# Patient Record
Sex: Male | Born: 1955
Health system: Western US, Academic
[De-identification: ages and names within clinical notes are randomized; demographics above are authoritative.]

---

## 2013-01-09 DIAGNOSIS — G4733 Obstructive sleep apnea (adult) (pediatric): Secondary | ICD-10-CM

## 2017-03-18 DIAGNOSIS — R7303 Prediabetes: Secondary | ICD-10-CM

## 2017-03-18 DIAGNOSIS — E1129 Type 2 diabetes mellitus with other diabetic kidney complication: Secondary | ICD-10-CM

## 2017-03-18 DIAGNOSIS — R809 Proteinuria, unspecified: Secondary | ICD-10-CM

## 2017-10-01 DIAGNOSIS — Z7901 Long term (current) use of anticoagulants: Secondary | ICD-10-CM

## 2018-01-20 ENCOUNTER — Ambulatory Visit: Payer: BLUE CROSS/BLUE SHIELD

## 2018-01-20 DIAGNOSIS — Z0000 Encounter for general adult medical examination without abnormal findings: Secondary | ICD-10-CM

## 2018-01-20 DIAGNOSIS — I4891 Unspecified atrial fibrillation: Secondary | ICD-10-CM

## 2018-01-20 DIAGNOSIS — I10 Essential (primary) hypertension: Secondary | ICD-10-CM

## 2018-01-20 DIAGNOSIS — E785 Hyperlipidemia, unspecified: Secondary | ICD-10-CM

## 2018-01-20 DIAGNOSIS — N50812 Left testicular pain: Secondary | ICD-10-CM

## 2018-01-20 DIAGNOSIS — Z6832 Body mass index (BMI) 32.0-32.9, adult: Secondary | ICD-10-CM

## 2018-01-20 DIAGNOSIS — E669 Obesity, unspecified: Secondary | ICD-10-CM

## 2018-01-20 DIAGNOSIS — Z23 Encounter for immunization: Secondary | ICD-10-CM

## 2018-01-20 MED ORDER — DABIGATRAN ETEXILATE MESYLATE 75 MG PO CAPS
75 mg | ORAL_CAPSULE | Freq: Two times a day (BID) | ORAL | 1 refills | Status: AC
Start: 2018-01-20 — End: 2018-01-21

## 2018-01-20 MED ORDER — LISINOPRIL 10 MG PO TABS
10 mg | ORAL_TABLET | Freq: Every day | ORAL | 1 refills | Status: AC
Start: 2018-01-20 — End: 2018-04-10

## 2018-01-20 MED ORDER — ATORVASTATIN CALCIUM 20 MG PO TABS
20 mg | ORAL_TABLET | Freq: Every day | ORAL | 1 refills | Status: AC
Start: 2018-01-20 — End: 2018-04-10

## 2018-01-20 MED ORDER — LISINOPRIL 5 MG PO TABS
5 mg | ORAL_TABLET | Freq: Every day | ORAL | 1 refills | Status: AC
Start: 2018-01-20 — End: 2018-01-21

## 2018-01-20 MED ORDER — DABIGATRAN ETEXILATE MESYLATE 150 MG PO CAPS
150 mg | ORAL_CAPSULE | Freq: Two times a day (BID) | ORAL | 0 refills | Status: SS
Start: 2018-01-20 — End: 2018-05-22

## 2018-01-20 MED ORDER — METOPROLOL TARTRATE 25 MG PO TABS
25 mg | ORAL_TABLET | Freq: Two times a day (BID) | ORAL | 1 refills | Status: AC
Start: 2018-01-20 — End: 2018-04-10

## 2018-01-20 MED ORDER — ATORVASTATIN CALCIUM 10 MG PO TABS
10 mg | ORAL_TABLET | Freq: Every day | ORAL | 1 refills | Status: AC
Start: 2018-01-20 — End: 2018-01-21

## 2018-01-20 NOTE — Assessment & Plan Note
THIS REPORT IS AUTOMATICALLY GENERATED BY THE [facility].     ADULT ASSESSMENT FEEDBACK REPORT    -------------------------    DEMOGRAPHICS    *Last 5 MRN# [id number]  *Gender: M  *Age: 62    -------------------------    Generalized Anxiety Disorder 7-item (GAD-7)  Score: GREEN (0) Individual is not currently experiencing anxiety. Highlight positive coping and encourage continued use of resiliency skills.    Patient Health Questionnaire 9-item (PHQ-9)  Score: GREEN (0) Individual does not exhibit emotional or functional problems indicative of depression. Current use of resilient behaviors should be identified and supported.    Alcohol Use Disorders Identification Test (AUDIT-C)  Score: YELLOW (4) Score of 0 on item 2 or item 3 suggests that individual is drinking within recommended limits. Individual?s alcohol intake over past few months should be reviewed to confirm accuracy.    GAD-7 Items  *only listed items scored 1 or higher (1 - Several days, 2 - More than half the days and 3 - Nearly every day)    PHQ-9 Items  *only listed items scored 1 or higher (1 - Several days, 2 - More than half the days and 3 - Nearly every day)    AUDIT-C Items  1. How often do you have a drink containing alcohol? -- 4 or more times a week (4)  2. How many standard drinks containing alcohol do you have on a typical day? -- 1 or 2 (0)  3. How often do you have six or more drinks on one occasion? -- Never (0)    Total Scores (Text Only):  GAD-7 = 0  PHQ-9 = 0  AUDIT-C = 4    --- END ---    [facility] Check-Up Report generated on 2018-01-20

## 2018-01-20 NOTE — Patient Instructions
pradaxa 150mg  twice a day  Lisinopril 10mg  once a day  Atorvastatin 20mg  once a day   Metoprolol 25mg  twice a day

## 2018-01-20 NOTE — H&P
WELLNESS VISIT        PATIENT: Calvin Chen  MRN: [id number]  DOB: [birth year]  DATE OF SERVICE: 01/20/2018    PRIMARY CARE PROVIDER: Veronda Prude, MD    Subjective:      Calvin Chen is a 62 y.o. male here for his annual wellness visit.    BP 136/69  ~ Pulse 69  ~ Temp 36.6 ???C (97.9 ???F) (Oral)  ~ Resp 18  ~ Ht 5' 7'' (1.702 m)  ~ Wt 209 lb (94.8 kg)  ~ SpO2 98%  ~ BMI 32.73 kg/m???       PHQ-9 total score:   PHQ-9 Total Score: 0 (01/20/18 1430)    GAD-7 total score:   Total score: 0 (01/20/18 1430)    DAST total score:  Total Score: 0 (01/20/18 1430)    Audit-C total score:   Total Score: 5 (01/20/18 1430)      Issues addressed today:    1. Hypertension : ran out of metoprolol so hasn't been taking it.  No chest pain or shortness of breath.      2. Hyperlipidemia : on atorvastatin 20mg  daily     3. Sleep apnea : using apap every night.    4.  Prediabetes: working on his diet and exercise.      Past Medical History:   Diagnosis Date   ??? Hyperlipidemia 2009   ??? Hypertension 2009   ??? Sleep apnea 2014    Use APAP     Past Surgical History:   Procedure Laterality Date   ??? none       Family History   Problem Relation Age of Onset   ??? Diabetes Mother    ??? Heart disease Mother    ??? Other (mitral valve disease) Brother      Social History     Social History   ??? Marital status: Married     Spouse name: N/A   ??? Number of children: N/A   ??? Years of education: N/A     Occupational History   ??? Not on file.     Social History Main Topics   ??? Smoking status: Never Smoker   ??? Smokeless tobacco: Never Used   ??? Alcohol use 2.4 oz/week     4 Glasses of Wine (5 oz) per week   ??? Drug use: No   ??? Sexual activity: Yes     Partners: Female     Other Topics Concern   ??? Do You Exercise At Least A Day, 3 Or More Days A Week? Yes   ??? Types Of Exercise? (List In Comments) Yes     Walking, cycling, gym   ??? Do You Follow A Special Diet? No   ??? Vegan? No   ??? Vegetarian? No   ??? Pescatarian? No   ??? Lactose Free? No   ??? Gluten Free? No ??? Omnivore? Yes     Social History Narrative    Diet: needs work    Exercise: has been increasing his level - walks daily; cycles 1-2 times a week, gym 1-2 times per week      Medications that the patient states to be currently taking   Medication Sig   ??? atorvastatin 20 mg tablet Take 1 tablet (20 mg total) by mouth daily.   ??? dabigatran 150 mg capsule Take 1 capsule (150 mg total) by mouth two (2) times daily.   ??? lisinopril 10 mg tablet Take 1 tablet (10 mg total) by  mouth daily.   ??? metoprolol tartrate 25 mg tablet Take 1 tablet (25 mg total) by mouth two (2) times daily.   ??? [DISCONTINUED] atorvastatin 10 mg tablet Take 10 mg by mouth daily.   ??? [DISCONTINUED] atorvastatin 10 mg tablet Take 1 tablet (10 mg total) by mouth daily.   ??? [DISCONTINUED] dabigatran 75 mg capsule Take 75 mg by mouth two (2) times daily.   ??? [DISCONTINUED] dabigatran 75 mg capsule Take 1 capsule (75 mg total) by mouth two (2) times daily.   ??? [DISCONTINUED] lisinopril 5 mg tablet Take 5 mg by mouth daily.   ??? [DISCONTINUED] lisinopril 5 mg tablet Take 1 tablet (5 mg total) by mouth daily.   ??? [DISCONTINUED] metoprolol tartrate 25 mg tablet Take 25 mg by mouth two (2) times daily.     Allergies   Allergen Reactions   ??? Sulfa Antibiotics Rash     Bactrim  Diffuse maculopapular rash, fever   ??? Naproxen Rash     Immunization History   Administered Date(s) Administered   ??? Influenza vaccine IM quadrivalent (Afluria Quad) (PF) SYR (83 years of age and older) 08/01/2017   ??? Tdap 06/23/2009   ??? influenza, unspecified formulation 06/23/2009, 10/30/2013, 07/16/2014, 09/30/2015, 08/01/2017, 09/20/2017   ??? pneumococcal polysaccharide vaccine 23-valent (Pneumovax) 03/18/2017   ??? zoster vac recomb adjuvanted (Shingrix) 01/20/2018     Health Maintenance Due   Topic Date Due   ??? HIV Screening  06/21/1976   ??? Shingles (Shingrix) Vaccine (1 of 2) 06/21/2008       Review of Systems:   A 14-system review of systems was performed and is negative except as stated in the history of present illness and patient intake form (scanned in chart).         Objective:        BP 136/69  ~ Pulse 69  ~ Temp 36.6 ???C (97.9 ???F) (Oral)  ~ Resp 18  ~ Ht 5' 7'' (1.702 m)  ~ Wt 209 lb (94.8 kg)  ~ SpO2 98%  ~ BMI 32.73 kg/m???     General Appearance:    Alert, cooperative, no distress, appears stated age     Head:    Normocephalic, without obvious abnormality, atraumatic     Eyes:    PERRL, conjunctiva/corneas clear, EOM's intact     Ears:    Normal TM's and external ear canals, both ears     Nose:   Nares normal   Throat:   Lips, mucosa, and tongue normal     Neck:   Supple, symmetrical, trachea midline, no adenopathy;        thyroid:  No enlargement/tenderness/nodules; no carotid    bruit or JVD     Back:     Symmetric, no curvature     Lungs:     Clear to auscultation bilaterally, respirations unlabored     Chest wall:    No tenderness or deformity     Heart:    Regular rate and rhythm, S1 and S2 normal, no murmur, rub    or gallop     Abdomen:     Soft, non-tender, bowel sounds active,     no masses, no organomegaly, obese      Genitalia:    Normal male without lesion, testis nl, no hernias  Left testicle is tender, small nodule on skin adjacent to scrotum      Rectal:    def       Extremities:  Extremities normal, atraumatic, no cyanosis or edema     Pulses:   2+ and symmetric      Skin:   Skin color, texture, turgor normal, no rashes or lesions         Neurologic:   nonfocal             Lab Review:   No results found for: CHOL, CHOLHDL, CHOLDLCAL, CHOLDLQ, [facility] Visit on 04/01/2008   Component Date Value Ref Range Status   ??? PSA,Total 04/01/2008 0.97  ng/mL Final    Comment: Total PSA Reference Range:                           58 - 76 year old:  0.0-2.5 ng/mL                           31 - 48 year old:  0.0-3.5 ng/mL                           68 - 38 year old:  0.0-4.5 ng/mL                           25 - 34 year old:  0.0-6.5 ng/mL Method: Roche-Electrochemiluminescence                           WARNING: The concentration of PSA in a given specimen                           determined with assays from different manufacturers can                           vary due to differences in assay methods and reagent                           specificity. Values obtained with different assay methods                           cannot be used interchangeably.                 Assessment & Plan:        Tanuj Mullens is a 62 y.o. male here for his yearly physical exam.  Diet, sleep and exercise were discussed.    Diagnoses and all orders for this visit:    Wellness examination  -     Referral to Clinical Nutrition  -     CBC & Auto Differential; Future  -     Comprehensive Metabolic Panel; Future  -     TSH with reflex FT4, FT3; Future  -     Lipid Panel; Future  -     Hgb A1c; Future    Pain in left testicle  -     Referral to Urology, Adult Urology  -     US scrotum and testicles wo+w duplex; Future    Class 1 obesity with serious comorbidity and body mass index (BMI) of 32.0 to 32.9 in adult, unspecified obesity type    Prediabetes  -     Hgb A1c; Future    Hyperlipidemia,  unspecified hyperlipidemia type  -     atorvastatin 20 mg tablet; Take 1 tablet (20 mg total) by mouth daily.    Long term current use of anticoagulant    Atrial fibrillation, unspecified type (HCC/RAF)  -     dabigatran 150 mg capsule; Take 1 capsule (150 mg total) by mouth two (2) times daily.    Essential hypertension  -     metoprolol tartrate 25 mg tablet; Take 1 tablet (25 mg total) by mouth two (2) times daily.  -     lisinopril 10 mg tablet; Take 1 tablet (10 mg total) by mouth daily.  -     CBC & Auto Differential; Future  -     Comprehensive Metabolic Panel; Future  -     TSH with reflex FT4, FT3; Future  -     Lipid Panel; Future  -     Hgb A1c; Future    Need for zoster vaccination  -     Zoster Vaccine (Shingrix) (order to administer in-clinic) [SINGLE ORDER, FOR ADMIN TODAY IN-OFFICE]    rec weight loss     Return in about 3 months (around 04/22/2018).    Veronda Prude, MD  Family Medicine  CPN [facility]

## 2018-01-21 LAB — TSH with reflex FT4, FT3: TSH: 0.99 u[IU]/mL (ref 0.3–4.7)

## 2018-01-21 LAB — Lipid Panel: NON-HDL,CHOLESTEROL,CALC: 150 mg/dL — ABNORMAL HIGH (ref <130)

## 2018-01-21 LAB — Comprehensive Metabolic Panel: POTASSIUM: 4.4 mmol/L (ref 3.6–5.3)

## 2018-01-21 LAB — Differential Automated: ABSOLUTE LYMPHOCYTE COUNT: 1.72 10*3/uL (ref 1.30–3.40)

## 2018-01-21 LAB — Hgb A1c: HGB A1C - HPLC: 6.7 — ABNORMAL HIGH (ref <5.7)

## 2018-01-21 LAB — CBC: MEAN CORPUSCULAR VOLUME: 85.1 fL (ref 79.3–98.6)

## 2018-02-19 ENCOUNTER — Ambulatory Visit

## 2018-03-16 ENCOUNTER — Ambulatory Visit

## 2018-04-01 ENCOUNTER — Ambulatory Visit

## 2018-04-08 ENCOUNTER — Ambulatory Visit

## 2018-04-08 DIAGNOSIS — E785 Hyperlipidemia, unspecified: Secondary | ICD-10-CM

## 2018-04-08 DIAGNOSIS — I480 Paroxysmal atrial fibrillation: Secondary | ICD-10-CM

## 2018-04-08 DIAGNOSIS — I10 Essential (primary) hypertension: Secondary | ICD-10-CM

## 2018-04-08 MED ORDER — RIVAROXABAN 20 MG PO TABS
20 mg | ORAL_TABLET | Freq: Every day | ORAL | 3 refills | Status: SS
Start: 2018-04-08 — End: 2018-05-20

## 2018-04-08 NOTE — Progress Notes
Outpatient Cardiology Consultation    PATIENT: Calvin Chen  MRN: [id number]  DOB: Jan 26, 1956  DATE OF SERVICE: 04/08/2018      PRIMARY CARE PROVIDER: Veronda Prude, MD    REFERRING PHYSICIAN:   Veronda Prude, MD  [address]  [id number]  [place], [place] [address]    DATE OF SERVICE: 04/08/2018    REASON FOR CONSULTATION: Paroxysmal atrial fibrillation    PROBLEM LIST:    Calvin Chen is a 61 y.o. male with a history of:    Paroxysmal Atrial Fibrillation:   Previously followed at [facility] and per patient on pradaxa, however not covered by insurance but xarelto is.  On metoprolol.  Hx of Afib with RVR 07/2017 Calvin Chen)    HTN:  On metoprolol 25 po bid and lisinopril 10.      Dyslipidemia:  On atorvastatin 20 mg    DM:  A1c 6.7 on June 2019.  Being followed by PCP.  Not on any DM meds.    HISTORY OF PRESENT ILLNESS/INTEVAL EVENTS:  04/08/18:   PATIENT PRESENTS TODAY TO ESTABLISH CARE IN CARDIOLOGY CLINIC FOR THE ABOVE mentioned problems.  Patient presents today to establish care in Cardiology Clinic for the above-mentioned problems he denies any symptoms.  Denies any chest pain, shortness of breath, dyspnea on exertion, palpitations, presyncope, or syncope.  He states that he was previously on Pradaxa however he was told that that is not covered by insurance any further and but the Xarelto is being covered.  Patient takes lisinopril and metoprolol as described above for blood pressure however he does not check blood pressure at home and is not sure what his baseline blood pressure.  Not taking any diabetes medications.      PAST MEDICAL HISTORY:  Past Medical History:   Diagnosis Date   ??? Hyperlipidemia 2009   ??? Hypertension 2009   ??? Sleep apnea 2014    Use APAP       PAST SURGICAL HISTORY:  Past Surgical History:   Procedure Laterality Date   ??? none         OUTPATIENT MEDICATIONS:  Medications that the patient states to be currently taking   Medication Sig   ??? atorvastatin 20 mg tablet Take 1 tablet (20 mg total) by mouth daily. ??? dabigatran 150 mg capsule Take 1 capsule (150 mg total) by mouth two (2) times daily.   ??? lisinopril 10 mg tablet Take 1 tablet (10 mg total) by mouth daily.   ??? metoprolol tartrate 25 mg tablet Take 1 tablet (25 mg total) by mouth two (2) times daily.       ALLERGIES:  Allergies   Allergen Reactions   ??? Sulfa Antibiotics Rash     Bactrim  Diffuse maculopapular rash, fever   ??? Naproxen Rash       SOCIAL HISTORY:  Social History     Social History   ??? Marital status: Married     Spouse name: N/A   ??? Number of children: N/A   ??? Years of education: N/A     Social History Main Topics   ??? Smoking status: Never Smoker   ??? Smokeless tobacco: Never Used   ??? Alcohol use 2.4 oz/week     4 Glasses of Wine (5 oz) per week   ??? Drug use: No   ??? Sexual activity: Yes     Partners: Female     Other Topics Concern   ??? Do You Exercise At Least A Day, 3  Or More Days A Week? Yes   ??? Types Of Exercise? (List In Comments) Yes     Walking, cycling, gym   ??? Do You Follow A Special Diet? No   ??? Vegan? No   ??? Vegetarian? No   ??? Pescatarian? No   ??? Lactose Free? No   ??? Gluten Free? No   ??? Omnivore? Yes     Social History Narrative    Diet: needs work    Exercise: has been increasing his level - walks daily; cycles 1-2 times a week, gym 1-2 times per week        FAMILY HISTORY:  Family History   Problem Relation Age of Onset   ??? Diabetes Mother    ??? Heart disease Mother    ??? Other (mitral valve disease) Brother        REVIEW OF SYSTEMS:  A 14 point review of systems was conducted and as per HPI and Interval Events otherwise negative.     PHYSICAL EXAMINATION:  VITALS: BP 155/95  ~ Pulse 71  ~ Temp 36.8 ???C (98.3 ???F) (Oral)  ~ Resp 18  ~ Ht 5' 7'' (1.702 m)  ~ Wt 216 lb (98 kg)  ~ SpO2 96%  ~ BMI 33.83 kg/m???    General: well developed well nourished male in no acute distress  HEENT: extraocular movements intact, anicteric, oropharynx clear, moist mucous membranes  Neck: no jugular venous distention. No lymphadenopathy CV: regular rate and rhythm, normal S1, S2. no rubs, murmurs, or gallops  Pulm: clear to auscultation bilaterally, no retractions  GI: soft, non tender, non distended, normoactive bowel sounds, no hepatosplenomegaly  Ext: no clubbing, cyanosis, or edema  Vascular: 2+ pulses throughout  Skin: warm, dry , and well perfused  Neuro: grossly non focal  Psych: mood and affect appropriate  MS: strength intact        LABORATORY:  Lab Results   Component Value Date    HGBA1C 6.7 (H) 01/20/2018     Last 3 Lipids (Up to last 3 results from past 62952 hours)      06/03 1556    Cholesterol       198  Comment:  The significance of total cholesterol depends on the values of LDL, HDL, triglycerides and the clinical context. A patient-provider discussion may be considered.       Cholesterol, HDL       48  Comment:  If HDL cholesterol level falls outside of the designatedrange, a patient-provider discussion is recommended       Triglycerides       178  Comment:  If Triglyceride level falls outside of the designated range,a patient-provider discussion is recommended.(!)       Non-HDL,Chol,Calc       150  Comment:  If Non-HDL cholesterol level falls outside of the designatedrange, a patient-provider discussion is recommended.(!)           2018 ACC/AHA guidelines recommends moderate or high-intensity statin because 10--year ASCVD risk>=7.5%. Patient is receiving guideline-directed statin therapy; monitor adherence.  10-year ASCVD risk  is 13.0%. Continue moderate or high-intensity statin therapy. Monitor adherence. as of 9:34 PM on 04/08/2018  10-year ASCVD risk with optimal risk factors is 4.6%.  Values used to calculate ASCVD score:  Age: 62 y.o.   Gender: Male Race: Not African American.  HDL cholesterol: 48 mg/dL. HDL cholesterol measured 78 days ago.  Total cholesterol: 198 mg/dL. Total cholesterol measured 78 days ago.  LDL cholesterol: 114 mg/dL.  LDL cholesterol measured 78 days ago. Systolic BP: 155 mm Hg. BP was measured today.  The patient is being treated with a medication that influences SBP.  The patient is currently not a smoker.  The patient does not have a diagnosis of diabetes.    Click here for the [facility] ASCVD Cardiovascular Risk Estimator Plus tool Office manager).        DIAGNOSTIC STUDIES:  ECG performed on 04/08/18 reviewed by me shows NSR 71 bpm, PR 138 msec, QRS 98 msec, QTc 434 msec.  TWI lateral leads.     TTE [facility] 07/2017  1. Technically difficult study.  2. Normal LV size, mild LVH, normal LVEF 55-60%.  3. Mild left atrial enlargement.  4. No significant valvular abnormalities.  5. IVC diameter is 2.2 cm with a < 50% inspiratory collapse, suggestive of a  right atrial pressure of 10-20 mmHg.    [facility] 07/2017  Cath Findings:   Coronary angiography   Left main: Minimal luminal irregularity   LAD : Minimal luminal irregularity   Diagonal : Minimal luminal irregularity   Circumflex : Minimal luminal irregularity   Obtuse marginal : Minimal luminal irregularity   RCA : Dominant vessel. Minimal luminal irregularity   PDA : Minimal luminal irregularity   PLV : Minimal luminal irregularity     Hemodynamics:   LVEDP 17 mmHg  LV 171/0 mmHg  AO 185/93/132 mmHg  ???  Left Ventriculogram??????  LV EF 60-65%  LV regional Motion abnormality: None  Mitral regurgitation: None     ASSESSMENT:  Calvin Chen is a 62 y.o. male with:    Paroxysmal Atrial Fibrillation:   Previously followed at [facility] and per patient on pradaxa, however not covered by insurance but xarelto is.  On metoprolol.  Hx of Afib with RVR 07/2017 Calvin Chen)  -Continue metoprolol 25 po bid  -Rivaroxaban 20 mg daily prescribed (CHA2DS2-Vasc score of 2 HTN and DM)    HTN:  On metoprolol 25 po bid and lisinopril 10.    - Above goal.  Advised daily BP logs at home in AM    Dyslipidemia:  On atorvastatin 20 mg  -Continue moderate dose statin.      Prediabetes/DM:  A1c 6.7 on June 2019.  Being followed by PCP.  Not on any DM meds. -As per PCP    In the absence of any new symptoms patient will return to clinic in 12 months.  Return precautions discussed and contact information provided.    Thank you Dr. Lucienne Minks for allowing me to participate in the care of your patient. Please do not hesitate to contact me if any questions or concerns arise in the meantime.     Lennox Solders MD PhD  [facility]  [address] [place], [address] [place]  Telephone: [phone]  Fax: [phone]  Email: [email or web address]  ???      Author: Lennox Solders, MD 04/08/2018 4:19 PM      paf  htn  [facility]  Had cath [facility] 07/2017

## 2018-04-09 DIAGNOSIS — E785 Hyperlipidemia, unspecified: Secondary | ICD-10-CM

## 2018-04-09 DIAGNOSIS — I10 Essential (primary) hypertension: Secondary | ICD-10-CM

## 2018-04-10 MED ORDER — LISINOPRIL 10 MG PO TABS
10 mg | ORAL_TABLET | Freq: Every day | ORAL | 1 refills | Status: SS
Start: 2018-04-10 — End: 2018-05-23

## 2018-04-10 MED ORDER — ATORVASTATIN CALCIUM 20 MG PO TABS
20 mg | ORAL_TABLET | Freq: Every day | ORAL | 1 refills | Status: AC
Start: 2018-04-10 — End: 2019-02-03

## 2018-04-10 MED ORDER — METOPROLOL TARTRATE 25 MG PO TABS
25 mg | ORAL_TABLET | Freq: Two times a day (BID) | ORAL | 1 refills | Status: AC
Start: 2018-04-10 — End: 2018-12-05

## 2018-05-19 ENCOUNTER — Inpatient Hospital Stay
Admit: 2018-05-19 | Discharge: 2018-05-22 | Disposition: A | Discharge status: Home / Self Care | Source: Home / Self Care

## 2018-05-19 ENCOUNTER — Ambulatory Visit

## 2018-05-19 DIAGNOSIS — I160 Hypertensive urgency: Secondary | ICD-10-CM

## 2018-05-19 DIAGNOSIS — N10 Acute tubulo-interstitial nephritis: Secondary | ICD-10-CM

## 2018-05-19 DIAGNOSIS — Q631 Lobulated, fused and horseshoe kidney: Secondary | ICD-10-CM

## 2018-05-19 DIAGNOSIS — N200 Calculus of kidney: Secondary | ICD-10-CM

## 2018-05-19 DIAGNOSIS — I10 Essential (primary) hypertension: Secondary | ICD-10-CM

## 2018-05-19 LAB — Sepsis Lactate Protocol: BLOOD LACTATE: 12 mg/dL (ref 5–25)

## 2018-05-19 LAB — Magnesium: MAGNESIUM: 2.1 meq/L — ABNORMAL HIGH (ref 1.4–1.9)

## 2018-05-19 LAB — Blood Culture Detection
BLOOD CULTURE FINAL STATUS: NEGATIVE
BLOOD CULTURE FINAL STATUS: NEGATIVE
BLOOD CULTURE FINAL STATUS: NEGATIVE
BLOOD CULTURE FINAL STATUS: NEGATIVE

## 2018-05-19 LAB — CBC
HEMATOCRIT: 48.2 (ref 38.5–52.0)
MEAN PLATELET VOLUME: 9.6 fL (ref 9.3–13.0)

## 2018-05-19 LAB — Prothrombin Time Panel: INR: 1.1 s (ref 11.5–14.4)

## 2018-05-19 LAB — Troponin I: TROPONIN I: <0.04 ng/mL (ref <0.1)

## 2018-05-19 LAB — Phosphorus: PHOSPHORUS: 3.9 mg/dL (ref 2.3–4.4)

## 2018-05-19 LAB — Hepatic Funct Panel: BILIRUBIN,TOTAL: 0.8 mg/dL (ref 0.1–1.2)

## 2018-05-19 LAB — Differential Automated: MONOCYTE PERCENT, AUTO: 6.6 (ref 0.00–0.10)

## 2018-05-19 LAB — Basic Metabolic Panel
CREATININE: 1.38 mg/dL — ABNORMAL HIGH (ref 0.60–1.30)
GFR ESTIMATE FOR NON-AFRICAN AMERICAN: 64 mL/min/{1.73_m2} (ref 96–106)
GLUCOSE: 167 mg/dL — ABNORMAL HIGH (ref 65–99)
POTASSIUM: 4.2 mmol/L (ref 3.6–5.3)

## 2018-05-19 LAB — UA,Dipstick: BILIRUBIN: NEGATIVE (ref 1.005–1.030)

## 2018-05-19 LAB — B-Type Natriuretic Peptide: BNP: 145 pg/mL — ABNORMAL HIGH (ref <100)

## 2018-05-19 LAB — UA,Microscopic: WBCS: 8 {cells}/uL (ref 0–22)

## 2018-05-19 LAB — Lipase: LIPASE: 10 U/L (ref 9–63)

## 2018-05-19 LAB — Amylase: AMYLASE: 69 U/L (ref 31–124)

## 2018-05-19 MED ADMIN — LISINOPRIL 10 MG PO TABS: 10 mg | ORAL | @ 17:00:00 | Stop: 2018-05-21

## 2018-05-19 MED ADMIN — LABETALOL HCL 5 MG/ML IV SOLN: 20 mg | INTRAVENOUS | @ 12:00:00 | Stop: 2018-05-21 | NDC 00143962201

## 2018-05-19 MED ADMIN — FENTANYL CITRATE (PF) 100 MCG/2ML IJ SOLN: 50 ug | INTRAVENOUS | @ 12:00:00 | Stop: 2018-05-19 | NDC 00641602725

## 2018-05-19 MED ADMIN — MORPHINE SULFATE (PF) 4 MG/ML IV SOLN: 4 mg | INTRAVENOUS | @ 10:00:00 | Stop: 2018-05-19 | NDC 00641612525

## 2018-05-19 MED ADMIN — LISINOPRIL 10 MG PO TABS: 10 mg | ORAL | @ 08:00:00 | Stop: 2018-05-19 | NDC 60687032501

## 2018-05-19 MED ADMIN — METOPROLOL TARTRATE 5 MG/5ML IV SOLN: 5 mg | INTRAVENOUS | @ 08:00:00 | Stop: 2018-05-19 | NDC 00409177805

## 2018-05-19 MED ADMIN — MORPHINE SULFATE (PF) 4 MG/ML IV SOLN: 4 mg | INTRAVENOUS | @ 07:00:00 | Stop: 2018-05-19 | NDC 00641612525

## 2018-05-19 MED ADMIN — ATORVASTATIN CALCIUM 20 MG PO TABS: 20 mg | ORAL | @ 17:00:00 | Stop: 2018-05-23 | NDC 00904629161

## 2018-05-19 MED ADMIN — METOPROLOL TARTRATE 25 MG PO TABS: 25 mg | ORAL | @ 17:00:00 | Stop: 2018-05-23 | NDC 51079025520

## 2018-05-19 MED ADMIN — LACTATED RINGERS IV SOLN: 75 mL/h | INTRAVENOUS | @ 12:00:00 | Stop: 2018-05-21 | NDC 00338011704

## 2018-05-19 MED ADMIN — IOHEXOL 350 MG/ML IV SOLN: 125 mL | INTRAVENOUS | @ 09:00:00 | Stop: 2018-05-19 | NDC 00407141476

## 2018-05-19 MED ADMIN — DOCUSATE SODIUM 100 MG PO CAPS: 100 mg | ORAL | @ 19:00:00 | Stop: 2018-05-19 | NDC 00904645561

## 2018-05-19 MED ADMIN — LISINOPRIL 10 MG PO TABS: 10 mg | ORAL | @ 17:00:00 | Stop: 2018-05-21 | NDC 60687032501

## 2018-05-19 MED ADMIN — CEFTRIAXONE 1GM IVPB (MINIBAG): 1 g | INTRAVENOUS | @ 09:00:00 | Stop: 2018-05-19 | NDC 00409733201

## 2018-05-19 MED ADMIN — SODIUM CHLORIDE 0.9 % IV BOLUS: 1000 mL | INTRAVENOUS | @ 07:00:00 | Stop: 2018-05-19 | NDC 00338004904

## 2018-05-19 MED ADMIN — OXYCODONE HCL 5 MG PO TABS: 5 mg | ORAL | @ 17:00:00 | Stop: 2018-05-22 | NDC 00406055262

## 2018-05-19 MED ADMIN — CLONIDINE HCL 0.1 MG PO TABS: .1 mg | ORAL | @ 09:00:00 | Stop: 2018-05-19 | NDC 00904565661

## 2018-05-19 MED ADMIN — HYDRALAZINE HCL 20 MG/ML IJ SOLN: 10 mg | INTRAVENOUS | @ 11:00:00 | Stop: 2018-05-19 | NDC 63323061401

## 2018-05-19 MED ADMIN — ONDANSETRON HCL 4 MG/2ML IJ SOLN: 4 mg | INTRAVENOUS | @ 20:00:00 | Stop: 2018-05-23 | NDC 00641607825

## 2018-05-19 MED ADMIN — METOPROLOL TARTRATE 25 MG PO TABS: 25 mg | ORAL | @ 17:00:00 | Stop: 2018-05-23

## 2018-05-19 MED ADMIN — MORPHINE SULFATE (PF) 4 MG/ML IV SOLN: 4 mg | INTRAVENOUS | @ 09:00:00 | Stop: 2018-05-19 | NDC 00641612525

## 2018-05-19 MED ADMIN — NICARDIPINE 50 MG/250 ML DRIP (PERIPHERAL): 5 mg/h | INTRAVENOUS | @ 15:00:00 | Stop: 2018-05-19

## 2018-05-19 MED ADMIN — ONDANSETRON HCL 4 MG/2ML IJ SOLN: 4 mg | INTRAVENOUS | @ 07:00:00 | Stop: 2018-05-19 | NDC 00641607825

## 2018-05-19 MED ADMIN — HYDRALAZINE HCL 20 MG/ML IJ SOLN: 20 mg | INTRAVENOUS | @ 13:00:00 | Stop: 2018-05-19 | NDC 63323061401

## 2018-05-19 MED ADMIN — OXYCODONE HCL 5 MG PO TABS: 5 mg | ORAL | @ 12:00:00 | Stop: 2018-05-22 | NDC 00406055262

## 2018-05-19 MED ADMIN — ATORVASTATIN CALCIUM 20 MG PO TABS: 20 mg | ORAL | @ 17:00:00 | Stop: 2018-05-23

## 2018-05-19 NOTE — H&P
PULMONARY & CRITICAL CARE PROGRESS NOTE      PRIMARY PHYSICIAN: Dr. Veronda Prude    LOS: 0  Admitted on 05/19/2018  Admitted to ICU on 05/19/2018  Reason for transfer: Uncontrolled Hypertension    HPI: Calvin Chen is a 62 y.o. male with PMH of HTN, HLD, and paroxysmal Afib admitted to the ICU with nephrolithiasis and uncontrolled hypertension. Pt presented to the ED with severe lower abdominal pain radiating to the back for the past 1 day. Pt was initially waxing and waning and sharp in nature, but has since become constant. Pain associated with nausea and vomiting earlier in the day, but that has since resolved. Pt denies any hematuria but does endorse dysuria yesterday which has since resolved.  Denies any associated CP or SOB. ED work up revealed leukocytosis, hematuria (on UA), and CT ab/pel showing horseshoe kidney with 3mm ureteral stone. Pt was incidentally noted to be hypertensive with persistently elevated SBP >200 while in the ED despite IV and PO medications and was admitted to the ICU for further care. Of noted, pt given ABx and urology consulted.     HOSPITAL EVENTS:    Past Medical History:   Diagnosis Date   ??? Hyperlipidemia 2009   ??? Hypertension 2009   ??? Sleep apnea 2014    Use APAP     Past Surgical History:   Procedure Laterality Date   ??? none       Allergies   Allergen Reactions   ??? Sulfa Antibiotics Rash     Bactrim  Diffuse maculopapular rash, fever   ??? Naproxen Rash     Social History   Substance Use Topics   ??? Smoking status: Never Smoker   ??? Smokeless tobacco: Never Used   ??? Alcohol use 2.4 oz/week     4 Glasses of Wine (5 oz) per week     Family History   Problem Relation Age of Onset   ??? Diabetes Mother    ??? Heart disease Mother    ??? Other (mitral valve disease) Brother        ROS: +severe lower abdominal pain, +N/V, now resolved, otherwise negative in all pertinent systems  HOME MEDICATIONS:  Prior to Admission medications    Medication Sig Start Date End Date Taking? Authorizing Provider atorvastatin 20 mg tablet Take 1 tablet (20 mg total) by mouth daily. 04/09/18   Veronda Prude, MD   dabigatran 150 mg capsule Take 1 capsule (150 mg total) by mouth two (2) times daily. 01/20/18   Veronda Prude, MD   lisinopril 10 mg tablet Take 1 tablet (10 mg total) by mouth daily. 04/09/18   Veronda Prude, MD   metoprolol tartrate 25 mg tablet Take 1 tablet (25 mg total) by mouth two (2) times daily. 04/09/18   Veronda Prude, MD   rivaroxaban (XARELTO) 20 mg tablet Take 1 tablet (20 mg total) by mouth daily with dinner. 04/08/18   Lennox Solders, MD       SCHEDULED MEDS:  ??? atorvastatin  20 mg Oral Daily   ??? cloNIDine  0.1 mg Oral BID   ??? lisinopril  10 mg Oral Daily   ??? lisinopril  10 mg Oral Daily   ??? metoprolol tartrate  25 mg Oral BID     CONTINUOUS INFUSIONS:  ??? niCARdipine 50 mg/250 ml (0.2 mg/ml) drip (Peripheral)       PRN MEDS:    HEMODYNAMIC PARAMETERS:     No intake/output data recorded.   No  intake or output data in the 24 hours ending 05/19/18 0403    VENT SETTINGS:        EKG/TELEMETRY: NSR with lateral TWI unchanged from prior    PHYSICAL EXAMINATION:  Temp:  [36.9 ???C (98.5 ???F)] 36.9 ???C (98.5 ???F)  Heart Rate:  [56-74] 63  Resp:  [14-18] 14  BP: (206-232)/(95-162) 218/114  SpO2:  [93 %-98 %] 93 %       INVASIVE DEVICES:    Gen: Uncomfortable, diaphoretic, but non-toxic appearing male, in pain  HEENT: NCAT, EOMI, MMM  CV: RRR, normal S1/S2, no MRG  Resp: CTABL  GI: Soft, +TTP in lower abdomen, no rebound/guarding/rigidity, no upper abdominal TTP  Neuro: AAOx3, moving all ext, following commands  Peripheral Pulse: normal  Skin color: pink  Cap refill: less than 2 seconds        LABORATORY STUDIES:  Recent Labs      05/19/18   0012   WBC  15.77*   HGB  16.5   HCT  48.2   PLT  305     Recent Labs      05/19/18   0012   NA  138   K  3.9   CL  98   CO2  28   BUN  18   CREAT  1.23   GFRESTNOAA  64   GFRESTAA  74   GLUCOSE  167*   CALCIUM  9.5      Recent Labs      05/19/18   0227   LACTATE  12     Recent Labs 05/19/18   0012   INR  1.1   PT  13.4     Recent Labs      05/19/18   0012   TOTPRO  8.3*   ALBUMIN  4.6   BILITOT  0.8   BILICON  0.2   ALT  19   AST  18   ALKPHOS  99   AMYLASE  69   LIPASE  10         MICROBIOLOGY:  - BCX #1 09/30: Pending  - BCX #2 09/30: Pending  - UCX 09/30: Pending    IMAGING:  - CT Ab/Pel 09/30:   1. ???Horseshoe kidney with 3 mm stone in the distal right ureter with associated mild hydronephrosis and heterogeneous parenchymal enhancement, suspicious for pyelonephritis.  2. ???Fluid-filled transverse and ascending colon with multiple foci of luminal narrowing, most compatible with bowel peristalsis. There is also equalization of the distal small bowel and the left abdomen, nonspecific but possibly representing delay in   bowel transit time.  3. ???Multiple lesions throughout the liver with suggestion of discontinuous peripheral enhancement, compatible with benign hemangiomas.    ASSESSMENT:  Calvin Chen is a 62 y.o. male with PMH of HTH, HLD, and Afib presenting with 1 day of severe abdominal pain, found to have rt ureteral stone and SBP >200, admitted to the ICU with the following active issues:  - Uncontrolled Hypertension  - Nephrolithiasis with solitary kidney  - ?Pyelonephritis  - Severe abdominal pain  - Afib  - HLD     PLAN/RECOMMENDATIONS:  - Uncontrolled Hypertension: Likely driven by pain, no evidence of any end organ damage as Cr at baseline and trop negative, continue to treat pain as below, will continue home meds (metop and lisinopril), continue PRN hydralazine/labetalol, will consider gtt if continues to remain uncontrolled  - Nephrolithiasis in solitary kidney: Urology consulted given hydro/stranding as seen on  CT, will continue IVF and monitoring for passing of stone, f/u Uro recs  - ?Pyelonephritis: UA findings could all be explained by stone, however given CT findings, hx of dysuria, and solitary kidney, will continue Ceftriaxone, and f/u Cx - Severe abdominal pain: Likely secondary to kidney stone, no aortic dissection or AAA seen on CT, continue PRN tylenol, oxycodone, and fentanyl pain, will uptitrate as needed  - Afib: Hold anticoagulation in case of possible procedures, however can likely resume tomorrow if no planned procedure  - HLD: Continue statin      SEDATION: None  ANALGESIA: tylenol, oxy, and fentanyl for breakthrough  NUTRITION: NPO for now  PPX: SCDs  CODE STATUS: Full  SURROGATE CONTACT: Wife    Critical care was necessary to treat or prevent imminent or life-threatening deterioration of the respiratory status and hemodynamics.  Critical Care Time excluding procedures:  45 minutes on 05/19/2018.       [place], MD  05/19/2018  4:03 AM

## 2018-05-19 NOTE — ED Provider Notes
[facility]  Emergency Department Service Report    Calvin Chen 62 y.o. male , presents with Abdominal Pain      Triage   Arrived on 05/18/2018 at 11:23 PM   Arrived by Walk-in [14]    ED Triage Vitals [05/18/18 2337]   Temp Temp Source BP Heart Rate Resp SpO2 O2 Device Pain Score Weight   36.9 ???C (98.5 ???F) Oral (!) 209/95 74 18 98 % None (Room air) Five 93.9 kg (207 lb)       Pre hospital care:       Allergies   Allergen Reactions   ??? Sulfa Antibiotics Rash     Bactrim  Diffuse maculopapular rash, fever   ??? Naproxen Rash       History   Patient is a 62 y.o. male with hx of HTN, HLD, pre-diabetes, previously on pradaxa and currently on metoprolol for A-fib, who presents to the ED with complaint of bilateral lower abdominal pain starting yesterday. Pain began gradually, was intermittent progressed to constant, aching; it is non-radiating, and without alleviating factors. Associated symptoms include back pain, constipation, nausea, nbnb vomiting x 4-5 times. Endorses mild dysuria starting yesterday however had spontaneously resolved today. No fevers. No surgical abdomen. Patient states he has not passed gas.      The history is provided by the patient. No language interpreter was used.   Abdominal Pain   The primary symptoms of the illness include abdominal pain, nausea, vomiting and dysuria. The primary symptoms of the illness do not include diarrhea. The onset of the illness was gradual. The problem has not changed since onset.  The pain came on gradually. The abdominal pain is located in the RLQ and LLQ. The abdominal pain does not radiate.   Additional symptoms associated with the illness include constipation and back pain.            Past Medical History:   Diagnosis Date   ??? Hyperlipidemia 2009   ??? Hypertension 2009   ??? Sleep apnea 2014    Use APAP        Past Surgical History:   Procedure Laterality Date   ??? none          Past Family History family history includes Diabetes in his mother; Heart disease in his mother; mitral valve disease in his brother.     Past Social History   he reports that he has never smoked. He has never used smokeless tobacco. He reports that he drinks about 2.4 oz of alcohol per week . He reports that he currently engages in sexual activity and has had male partners. He reports that he does not use drugs.     Review of Systems   Gastrointestinal: Positive for abdominal pain, constipation, nausea and vomiting. Negative for diarrhea.   Genitourinary: Positive for dysuria.   Musculoskeletal: Positive for back pain.   All other systems reviewed and are negative.      Physical Exam   Physical Exam   Constitutional: He appears well-developed and well-nourished. No distress.   HENT:   Head: Normocephalic and atraumatic.   Eyes: Conjunctivae and EOM are normal.   Neck: Normal range of motion. Neck supple.   Cardiovascular: Normal rate and regular rhythm.    Pulmonary/Chest: Effort normal and breath sounds normal. No respiratory distress.   Abdominal: Soft. There is tenderness (bilateral lower quadrants). There is no rebound and no guarding.   Decreased bowel sounds but still present  Musculoskeletal: Normal range of motion.   Lymphadenopathy:     He has no cervical adenopathy.   Neurological: He is alert. No sensory deficit.   Moving all four extremities.   Skin: Skin is warm and dry.   Psychiatric: His behavior is normal.   Nursing note and vitals reviewed.      ED Course          Laboratory Results     Labs Reviewed   BASIC METABOLIC PANEL - Abnormal; Notable for the following:        Result Value    Glucose 167 (*)     All other components within normal limits   HEPATIC FUNCT PANEL - Abnormal; Notable for the following:     Total Protein 8.3 (*)     All other components within normal limits   UA,DIPSTICK - Abnormal; Notable for the following:     Blood 2+ (*)     Glucose 1+ (*)     Protein 2+ (*) All other components within normal limits   UA,MICROSCOPIC - Abnormal; Notable for the following:     RBC per uL 331 (*)     RBC per HPF 70 (*)     All other components within normal limits   CBC (PERFORMABLE) - Abnormal; Notable for the following:     White Blood Cell Count 15.77 (*)     All other components within normal limits   DIFFERENTIAL, AUTOMATED (PERFORMABLE) - Abnormal; Notable for the following:     Absolute Neut Count 13.33 (*)     Absolute Lymphocyte Count 1.23 (*)     Absolute Mono Count 1.04 (*)     Absolute Immature Gran Count 0.08 (*)     All other components within normal limits   PROTHROMBIN TIME PANEL - Normal   LIPASE - Normal   AMYLASE - Normal   BACTERIAL CULTURE BLOOD    Narrative:     The following orders were created for panel order Blood culture #1.  Procedure                               Abnormality         Status                     ---------                               -----------         ------                     Bacterial Culture [id number]]                          In process                   Please view results for these tests on the individual orders.   BACTERIAL CULTURE BLOOD    Narrative:     The following orders were created for panel order Blood culture #2.  Procedure                               Abnormality         Status                     ---------                               -----------         ------  Bacterial Culture [id number]]                          In process                   Please view results for these tests on the individual orders.   BACTERIAL CULTURE URINE   BACTERIAL CULTURE BLOOD.   BACTERIAL CULTURE BLOOD.   CBC & AUTO DIFFERENTIAL    Narrative:     The following orders were created for panel order CBC & Plt & Diff.  Procedure                               Abnormality         Status                     ---------                               -----------         ------ YQM[[id number]]                          Abnormal            Final result               Differential, Automated[[id number]]      Abnormal            Final result                 Please view results for these tests on the individual orders.   URINALYSIS W/REFLEX TO CULTURE    Narrative:     The following orders were created for panel order Urinalysis w/Reflex to Culture.  Procedure                               Abnormality         Status                     ---------                               -----------         ------                     UA,Dipstick[[id number]]                  Abnormal            Final result               UA,Microscopic[[id number]]               Abnormal            Final result                 Please view results for these tests on the individual orders.   SEPSIS LACTATE       Imaging Results     CT abd+pelvis w contrast   Final Result by Sherril Croon., MD (09/30 0246)   IMPRESSION:      1.  Horseshoe kidney with 3 mm  stone in the distal right ureter with associated mild hydronephrosis and heterogeneous parenchymal enhancement, suspicious for pyelonephritis.   2.  Fluid-filled transverse and ascending colon with multiple foci of luminal narrowing, most compatible with bowel peristalsis. There is also equalization of the distal small bowel and the left abdomen, nonspecific but possibly representing delay in    bowel transit time.   3.  Multiple lesions throughout the liver with suggestion of discontinuous peripheral enhancement, compatible with benign hemangiomas.         I, Roanna Banning, M.D., have reviewed this radiological study personally and I am in full agreement with the findings of the report presented here.      Dictated by: Loran Senters   05/19/2018 2:02 AM      Signed by: Roanna Banning   05/19/2018 2:46 AM          Administered Medications     Medication Administration from 05/18/2018 2323 to 05/19/2018 0251       Date/Time Order Dose Route Action Action by Comments 05/19/2018 0025 morphine PF 4 mg/mL inj 4 mg 4 mg IV Push Given Anibal Henderson, RN      05/19/2018 0025 ondansetron 4 mg/2 mL inj 4 mg 4 mg IV Push Given Anibal Henderson, RN      05/19/2018 0237 sodium chloride 0.9% IV soln bolus 1,000 mL 0 mL Intravenous Stopped Anibal Henderson, RN      05/19/2018 0025 sodium chloride 0.9% IV soln bolus 1,000 mL 1,000 mL Intravenous New Bag/ Syringe/ Cartridge Anibal Henderson, RN      05/19/2018 0120 lisinopril tab 10 mg 10 mg Oral Given Anibal Henderson, RN      05/19/2018 0119 metoprolol 5 mg/5 mL inj 5 mg 5 mg IV Push Given Anibal Henderson, RN      05/19/2018 0145 iohexol (Omnipaque) 350 mg/mL inj 125 mL 125 mL Intravenous Given Tillie Fantasia 2cc/sec     05/19/2018 0214 morphine PF 4 mg/mL inj 4 mg 4 mg IV Push Given Anibal Henderson, RN      05/19/2018 0229 cefTRIAXone 1 g IVPB (MINIBAG for RR/[facility] ED ONLY) 1 g Intravenous New Bag/ Syringe/ Cartridge Anibal Henderson, RN      05/19/2018 0229 cloNIDine tab 0.1 mg 0.1 mg Oral Given Anibal Henderson, RN      05/19/2018 0232 morphine PF 4 mg/mL inj 4 mg 4 mg IV Push Given Anibal Henderson, RN           Procedures   ED ECG interpretation  Date/Time: 05/19/2018 3:06 AM  Performed by: Rubye Beach, JEFFREY P.  Authorized by: Rubye Beach, JEFFREY P.     ECG reviewed by ED Physician in the absence of a cardiologist: yes    Previous ECG:     Previous ECG:  Compared to current    Comparison ECG info:  Aug 20    Similarity:  No change  Interpretation:     Interpretation: non-specific    Rate:     ECG rate:  62    ECG rate assessment: normal    Rhythm:     Rhythm: sinus rhythm    Ectopy:     Ectopy: none    QRS:     QRS axis:  Normal  Conduction:     Conduction: normal    ST segments:     ST segments:  Normal  T waves:     T waves: non-specific      T  waves comment:  Laterally  Comments:      No acute ST or T wave changes.          MDM  Medical Decision Making: Patient presents with abdominal pain.     Labs were performed to evaluate for evidence of electrolyte abnormalities such as hyponatremia, hyperglycemia. CBC to evaluate for leukocytosis, anema. LFT???s and lipase were performed to evaluate for evidence of pancreatitis or hepatitis. Urine testing done for any signs of infection.     Radiology studies, CT abdomen/pelvis was performed to evaluate for intra-abdominal source such as appendicitis, diverticulitis, obstruction, abscess.    Patient  improved in the ER with IV pain medications, IV antiemetics, and IV hydration.    Etiology of symptoms likely: ***. Considered but at this time have low clinical suspicion for other pathologies such as appendicitis, diverticulitis, GI bleed, testicular torsion, kidney stone, obstruction, volvulus, vascular causes, among other differentials considered.       3:05 AM  Consult: I spoke with the radiologist on call and reviewed and discussed the CT findings on this patient. Concerned for stone in the right kidney with perinephric stranding.    Clinical Impression     1. Acute pyelonephritis    2. Accelerated hypertension        Prescriptions     New Prescriptions    No medications on file       Disposition and Follow-up   Disposition: Admit [3]    No future appointments.    Follow up with:  No follow-up provider specified.    Return precautions are specified on After Visit Summary.    The documentation on this chart was performed by Tomasita Crumble, scribed for Roselind Messier., MD    05/18/2018 11:57 PM     ***

## 2018-05-19 NOTE — Consults
Department of Urology  ______________________________________________  CONSULT    PATIENT:  Calvin Chen  MRN:  [id number]  DOB:  05/25/1956  DATE OF SERVICE:  05/19/2018    UROLOGY ATTENDING:  Valere Dross, MD  REFERRING PHYSICIAN:  Kerney Elbe., MD        LOS: 0 days     REASON FOR CONSULT Horseshoe kidney with 3mm ureteral stone     SUBJECTIVE:   HISTORY OF PRESENT ILLNESS:  Calvin Chen is a 62 y.o. male with a past medical history of HTN, HLD, and paroxysmal Afib admitted to the ICU with nephrolithiasis and uncontrolled hypertension. Pt presented to the ED with severe lower abdominal pain radiating to the back for the past 1 day. Pt was initially waxing and waning and sharp in nature, but has since become constant. Pain associated with nausea and vomiting yesterday, but that has since resolved.     Pt denies any hematuria but does endorse dysuria Saturday which has since resolved, and endorses frequency at least q1h.  Denies any associated fever, chills, cp or sob. ED work up revealed leukocytosis, hematuria (on UA), and CT ab/pel showing horseshoe kidney with 3mm ureteral stone. Pt was incidentally noted to be hypertensive with persistently elevated SBP >200 while in the ED despite IV and PO medications and was admitted to the ICU for further care antibiotic initiated..      ALLERGIES:  Allergies   Allergen Reactions   ??? Sulfa Antibiotics Rash     Bactrim  Diffuse maculopapular rash, fever   ??? Naproxen Rash       MEDICATIONS:    Prior to Admission medications    Medication Sig Start Date End Date Taking? Authorizing Provider   atorvastatin 20 mg tablet Take 1 tablet (20 mg total) by mouth daily. 04/09/18   Veronda Prude, MD   dabigatran 150 mg capsule Take 1 capsule (150 mg total) by mouth two (2) times daily. 01/20/18   Veronda Prude, MD   lisinopril 10 mg tablet Take 1 tablet (10 mg total) by mouth daily. 04/09/18   Veronda Prude, MD   metoprolol tartrate 25 mg tablet Take 1 tablet (25 mg total) by mouth two (2) times daily. 04/09/18   Veronda Prude, MD   rivaroxaban (XARELTO) 20 mg tablet Take 1 tablet (20 mg total) by mouth daily with dinner. 04/08/18   Lennox Solders, MD       PAST MEDICAL HISTORY:  Past Medical History:   Diagnosis Date   ??? Hyperlipidemia 2009   ??? Hypertension 2009   ??? Sleep apnea 2014    Use APAP   Pre-DM per patient    PAST SURGICAL HISTORY:  Past Surgical History:   Procedure Laterality Date   ??? none         FAMILY HISTORY: family history includes Diabetes in his mother; Heart disease in his mother; mitral valve disease in his brother.    SOCIAL HISTORY:  noncontrib   Social History   Substance Use Topics   ??? Smoking status: Never Smoker   ??? Smokeless tobacco: Never Used   ??? Alcohol use 2.4 oz/week     4 Glasses of Wine (5 oz) per week       REVIEW OF SYSTEMS:  A 14 point review of systems has been completed and are negative, except for what is noted in the HPI.    OBJECTIVE:   VITALS:  Temp:  [36.7 ???C (98 ???F)-36.9 ???C (98.5 ???F)] 36.7 ???C (98 ???F)  Heart Rate:  [56-87] 83  Resp:  [13-20] 20  BP: (163-232)/(93-162) 171/95  NBP Mean:  [113-143] 116  SpO2:  [92 %-98 %] 92 %   Vitals:    05/19/18 0500   Weight:    Height: 1.702 m (5' 7'')       PE:   GENERAL: Comfortable, NAD, A&Ox3  HEENT: NC/AT, EOMI  CARDIOVASCULAR: Regular rate and rhythm, no ectopy on monitor.  RESPIRATORY: Breathing comfortably on RA, no accessory muscle use, no acute respiratory distress   GI: Soft, NT/ND, no guarding, no rebound   GU: CVA tenderness: mild to Right flank  EXT: WWP, no c/c/e    DRAINS: none    LABS:   Lab Results   Component Value Date    WBC 20.78 (H) 05/19/2018    HGB 16.1 05/19/2018    HCT 48.4 05/19/2018    PLT 291 05/19/2018     Lab Results   Component Value Date    NA 137 05/19/2018    K 4.2 05/19/2018    CL 99 05/19/2018    CO2 27 05/19/2018    BUN 18 05/19/2018    CREAT 1.38 (H) 05/19/2018    GLUCOSE 205 (H) 05/19/2018    CALCIUM 9.3 05/19/2018    MG 2.1 (H) 05/19/2018    PHOS 3.9 05/19/2018     Lab Results Component Value Date    PT 13.4 05/19/2018    INR 1.1 05/19/2018       Lab Results   Component Value Date    PSATOTAL 0.97 04/01/2008       MICROBIOLOGY  Blood Cultures  -NTD  Urine Cultures  -In Process  Respiratory Cultures  -In Process     IMAGES/TESTS:   Pertinent imaging reviewed.    05/19/2018 CT A/P  IMPRESSION:  1.  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  2.  Fluid-filled transverse and ascending colon with multiple foci of luminal narrowing, most compatible with bowel peristalsis. There is also equalization of the distal small bowel and the left abdomen, nonspecific but possibly representing delay in   bowel transit time.  3.  Multiple lesions throughout the liver with suggestion of discontinuous peripheral enhancement, compatible with benign hemangiomas.    ASSESSMENT/PLAN:   Calvin Chen is a 62 y.o. male with a past medical history of HTN, HLD, and paroxysmal Afib admitted to the ICU with nephrolithiasis and uncontrolled hypertension.  CT ab/pel showing horseshoe kidney with 3mm in right distal ureter with mild hydronephrosis.    Urology Recommendations:   - Start flomax 0.4mg  daily.  - Aggressive hydration.  - Trial of passage- please have patient strain his urine.  - May need a CT in a few weeks to evaluate if stone is still present.  - Outpatient follow-up with Dr. Lenny Pastel for further stone work-up in 4 weeks  - Continue to trend Cr.  - Please check PVRs/bladder scan to evaluate urinary retention.  - Please page [phone] Urology with any questions.    Patient was seen with the Urology Team and discussed with attending physician Valere Dross, MD who agrees with above.      Author:  Azalee Course. Dorene Sorrow, NP  7:51 AM 05/19/2018  Department of Urology  [facility]

## 2018-05-19 NOTE — Progress Notes
PULMONARY & CRITICAL CARE PROGRESS NOTE    PRIMARY PHYSICIAN: Veronda Prude, MD     LOS: 0  Admitted on 05/19/2018  Admitted to ICU on 05/19/2018  Reason for transfer: Uncontrolled Hypertension    HPI: Kishaun Erekson is a 62 y.o. male with PMH of HTN, HLD, and paroxysmal Afib admitted to the ICU with nephrolithiasis and uncontrolled hypertension. Pt presented to the ED with severe lower abdominal pain radiating to the back for the past 1 day. Pt was initially waxing and waning and sharp in nature, but has since become constant. Pain associated with nausea and vomiting earlier in the day, but that has since resolved. Pt denies any hematuria but does endorse dysuria yesterday which has since resolved.  Denies any associated CP or SOB. ED work up revealed leukocytosis, hematuria (on UA), and CT ab/pel showing horseshoe kidney with 3mm ureteral stone. Pt was incidentally noted to be hypertensive with persistently elevated SBP >200 while in the ED despite IV and PO medications and was admitted to the ICU for further care. Of noted, pt given ABx and urology consulted.     HOSPITAL EVENTS:  9/30:   BP improved after labetalol 20 mg, then hydralazine 20 mg IV.   Pain significantly improved.    Past Medical History:   Diagnosis Date   ??? Hyperlipidemia 2009   ??? Hypertension 2009   ??? Sleep apnea 2014    Use APAP     Past Surgical History:   Procedure Laterality Date   ??? none       Allergies   Allergen Reactions   ??? Sulfa Antibiotics Rash     Bactrim  Diffuse maculopapular rash, fever   ??? Naproxen Rash     Social History   Substance Use Topics   ??? Smoking status: Never Smoker   ??? Smokeless tobacco: Never Used   ??? Alcohol use 2.4 oz/week     4 Glasses of Wine (5 oz) per week     Family History   Problem Relation Age of Onset   ??? Diabetes Mother    ??? Heart disease Mother    ??? Other (mitral valve disease) Brother        ROS: +severe lower abdominal pain, +N/V, now resolved, otherwise negative in all pertinent systems HOME MEDICATIONS:  Prior to Admission medications    Medication Sig Start Date End Date Taking? Authorizing Provider   atorvastatin 20 mg tablet Take 1 tablet (20 mg total) by mouth daily. 04/09/18   Veronda Prude, MD   dabigatran 150 mg capsule Take 1 capsule (150 mg total) by mouth two (2) times daily. 01/20/18   Veronda Prude, MD   lisinopril 10 mg tablet Take 1 tablet (10 mg total) by mouth daily. 04/09/18   Veronda Prude, MD   metoprolol tartrate 25 mg tablet Take 1 tablet (25 mg total) by mouth two (2) times daily. 04/09/18   Veronda Prude, MD   rivaroxaban (XARELTO) 20 mg tablet Take 1 tablet (20 mg total) by mouth daily with dinner. 04/08/18   Lennox Solders, MD       SCHEDULED MEDS:  ??? atorvastatin  20 mg Oral Daily   ??? cefTRIAXone  1 g Intravenous Q24H   ??? influenza vaccine injection inpatient  0.5 mL Intramuscular Once   ??? lisinopril  10 mg Oral Daily   ??? metoprolol tartrate  25 mg Oral BID     CONTINUOUS INFUSIONS:  ??? lactated ringers 75 mL/hr (05/19/18 0528)  PRN MEDS:    HEMODYNAMIC PARAMETERS:     I/O last 2 completed shifts:  In: 215 [P.O.:100; I.V.:115]  Out: 150 [Urine:150]     Intake/Output Summary (Last 24 hours) at 05/19/18 1011  Last data filed at 05/19/18 0900   Gross per 24 hour   Intake              440 ml   Output              250 ml   Net              190 ml       VENT SETTINGS:        EKG/TELEMETRY: NSR with lateral TWI unchanged from prior    PHYSICAL EXAMINATION:  Temp:  [36.7 ???C (98 ???F)-36.9 ???C (98.5 ???F)] 36.8 ???C (98.2 ???F)  Heart Rate:  [56-88] 76  Resp:  [13-21] 19  BP: (155-232)/(93-162) 155/96  NBP Mean:  [113-143] 113  SpO2:  [90 %-98 %] 95 %       INVASIVE DEVICES:    Gen: Uncomfortable, diaphoretic, but non-toxic appearing male, in pain  HEENT: NCAT, EOMI, MMM  CV: RRR, normal S1/S2, no MRG  Resp: CTABL  GI: Soft, +TTP in lower abdomen, no rebound/guarding/rigidity, no upper abdominal TTP  Neuro: AAOx3, moving all ext, following commands  Peripheral Pulse: normal  Skin color: pink Cap refill: less than 2 seconds        LABORATORY STUDIES:  Recent Labs      05/19/18   0710  05/19/18   0012   WBC  20.78*  15.77*   HGB  16.1  16.5   HCT  48.4  48.2   PLT  291  305     Recent Labs      05/19/18   0710  05/19/18   0012   NA  137  138   K  4.2  3.9   CL  99  98   CO2  27  28   BUN  18  18   CREAT  1.38*  1.23   GFRESTNOAA  56  64   GFRESTAA  64  74   GLUCOSE  205*  167*   CALCIUM  9.3  9.5   MG  2.1*   --    PHOS  3.9   --       Recent Labs      05/19/18   0227  05/19/18   0012   TROPONIN   --   <0.04   LACTATE  12   --      Recent Labs      05/19/18   0012   INR  1.1   PT  13.4     Recent Labs      05/19/18   0012   TOTPRO  8.3*   ALBUMIN  4.6   BILITOT  0.8   BILICON  0.2   ALT  19   AST  18   ALKPHOS  99   AMYLASE  69   LIPASE  10         MICROBIOLOGY:  - BCX #1 09/30: Pending  - BCX #2 09/30: Pending  - UCX 09/30: Pending    IMAGING:  - CT Ab/Pel 09/30:   1. ???Horseshoe kidney with 3 mm stone in the distal right ureter with associated mild hydronephrosis and heterogeneous parenchymal enhancement, suspicious for pyelonephritis.  2. ???Fluid-filled transverse and ascending colon with multiple  foci of luminal narrowing, most compatible with bowel peristalsis. There is also equalization of the distal small bowel and the left abdomen, nonspecific but possibly representing delay in   bowel transit time.  3. ???Multiple lesions throughout the liver with suggestion of discontinuous peripheral enhancement, compatible with benign hemangiomas.    ASSESSMENT:  Tia Gelb is a 63 y.o. male with     ACUTE:  Hypertensive urgency  Nephrolithiasis with solitary kidney  Mild hydronephrosis  Possible Pyelonephritis, POA  Severe abdominal pain, resolved    CHRONIC:  Afib  HLD  HTH  OSA, Dx 2014 with AHI 26/hr; APAP 12 to 15 recommended  Lung nodule, 5 mm LLL  Liver lesions, possible hemangiomas  Hiatal hernia  Left inguinal hernia  Left iliac vessels with sclerotic foci, c/w enchondroma      PLAN/RECOMMENDATIONS: Strain urine to collect stone  continue home meds (metop and lisinopril)  PRN hydralazine  Prn labetalol  Urology consulted   continue Ceftriaxone  f/u Cx  Hold anticoagulation   Continue statin  APAP 12 to 15 during sleep; in house use BPAP 14/11 nocturnal  OOB  SEDATION: None  ANALGESIA: tylenol, oxy, and fentanyl for breakthrough  IV FLUID: LR @ 75  NUTRITION: NPO for now  PPX: SCDs  CODE STATUS: Full  SURROGATE CONTACT: Wife    Critical care was necessary to treat or prevent imminent or life-threatening deterioration of the respiratory status and hemodynamics.    Critical Care Time excluding procedures: additional 40 minutes on 05/19/2018.       Maurice March Bernice Mullin, MD  05/19/2018  10:11 AM

## 2018-05-19 NOTE — Nursing Note
CRITICAL CARE TRANSPORT RN NOTE    4:46 AM  Patient transported from ED to 4CWICU on monitor. On O2 via NC at 3LPM. BP elevated despite previous medical interventions in ED, will start on Nicardipine drip in ICU once settled. Complained of pain, MD aware, will prescribe pain meds. Tolerated transport well and was uneventful. All belongings with patient.  Settled into room.  Report given to Caren Griffins, primary RN. All questions answered.    [facility] [address], [place]  05/19/2018  4:46 AM

## 2018-05-19 NOTE — Progress Notes
INTERNAL MEDICINE - INPATIENT PROGRESS NOTE    ADMISSION DATE: 05/19/2018  DATE OF SERVICE: 05/19/2018  HOSPITAL DAY: 0    PRINCIPLE PROBLEM: Kidney stone    CC: Abdominal Pain (c/o RLQ pain radiating to his lower back that began yesterday with constipation and abdominal distention.)    INTERVAL EVENTS AND REVIEW OF SYSTEMS:   62 y.o. male with PMH of HTN, HLD, and paroxysmal Afib admitted to the ICU with nephrolithiasis and uncontrolled hypertension, got 1 dose labetelol and 1 dose hydral. BP and pain better and transferring to medicine team.     He reports that he is feeling better now, he thinks that stone has passed into his bladder. He generally does not have problems w/BP like this. No CP or SOB, no N/V, no F/C.     Home Meds:  Metop 25 BID  Lisinopril 10  Atorva   Xarelto - has not been on d/t insurance denies, has been off AC for ~month    MEDICATIONS:    atorvastatin 20 mg Oral Daily   cefTRIAXone 1 g Intravenous Q24H   docusate 100 mg Oral BID   lisinopril 10 mg Oral Daily   metoprolol tartrate 25 mg Oral BID     PRNs: acetaminophen, bisacodyl, hydrALAZINE, HYDROmorphone, labetalol, lactulose, ondansetron, oxyCODONE, polyethylene glycol, senna    VITALS:  Temp:  [36.7 ???C (98 ???F)-36.9 ???C (98.5 ???F)] 36.7 ???C (98 ???F)  Heart Rate:  [56-88] 86  Resp:  [13-21] 19  BP: (155-232)/(89-162) 163/89  NBP Mean:  [106-143] 106  SpO2:  [90 %-98 %] 96 %     Weight: 93.9 kg (207 lb) Oxygen Therapy  SpO2: 96 %  O2 Device: Nasal cannula  Flow Rate (L/min): 2 L/min     IN'S AND OUT'S:  I/O last 2 completed shifts:  In: 290 [P.O.:100; I.V.:190]  Out: 150 [Urine:150]    PHYSICAL EXAM:  General: alert, well appearing, and in no distress.   Cardiac: regular rate and rhythm, no murmurs. Normal dorsalis pedal pulses bilateral. No peripheral edema.  Lungs: Clear to auscultation, normal work of breathing  Abdomen: soft, nontender, nondistended  Neuro: fully oriented    DATA I have reviewed the following information from the last 24 hours: allied health and treating physician notes, imaging, labs and microbiology data and cardiac studies and telemetry data (if on monitor)  Na/K/Cl/CO2/BUN/Cr/glu:  137/4.2/99/27/18/1.38/205 (09/30 0710)  AST/ALT/Bili T/Alk Phos/Prot/Alb:  18/19/0.8/99/8.3/4.6 (09/30 0012)  WBC/Hgb/Hct/Plts:  20.78/16.1/48.4/291 (09/30 0710)  PT/INR/APTT/Fib:  13.4/1.1/--/-- (09/30 0012)         05/19/2018 CT A/P  IMPRESSION:  1. ???Horseshoe kidney with 3 mm stone in the distal right ureter with associated mild hydronephrosis and heterogeneous parenchymal enhancement, suspicious for pyelonephritis.  2. ???Fluid-filled transverse and ascending colon with multiple foci of luminal narrowing, most compatible with bowel peristalsis. There is also equalization of the distal small bowel and the left abdomen, nonspecific but possibly representing delay in bowel transit time.  3. ???Multiple lesions throughout the liver with suggestion of discontinuous peripheral enhancement, compatible with benign hemangiomas    ASSESSMENT/PLAN:  Calvin Chen is a 62 y.o. male w/PMH of HTN, HLD, and paroxysmal Afib admitted to the ICU with nephrolithiasis and uncontrolled hypertension. BP and pain better and transferring to medicine team.      #HTN w/HTN urgency m/l d/t pain  - continue home BP meds, titrate up prn  - prn IV labetalol if SBP >180  - control pain    #AKI, could be r/t  kidney stone +/- pyelo as below, though also could be r/t HTN as above  #Possible pyelo on imaging, though UA neg   #Nephrolitiasis  - pain control  - appreciate urology consult  - strain urine to collect stones  - continue CTX for now  - Flomax 0.4 (low risk of cross-reaction w/sulfa allergy but will monitor)  - Continue IV hydration  - check PVR (was low at 94)  - F/u urine culture  - Repeat BMP w/aml, further workup AKI if cr continues to rise    #Afib  - DOAC held, resume as no procedures planned however he has not been taking as insurance denied coverage  - will send xarelto script to our pharmacy to check coverage, may need PA or may need to switch to alternative     #HLD, continue atorva  #OSA Dx 2014 with AHI 26/hr; APAP 12 to 15 recommended  - continue bipap  #Lung nodule, 5 mm LLL, f/u out patient  #Liver lesions, possible hemangiomas  #Hiatal hernia  #Left inguinal hernia  #Left iliac vessels with sclerotic foci, c/w enchondroma    INPATIENT CHECKLIST:  Diet 2 gram sodium  VTE Prophylaxis: low risk  Central Lines: none  Tubes/Drains: none  Activity: as tolerated  Precautions: No infectious isolation, standard precautions.    ADVANCED DIRECTIVES:  Full Code, Primary Emergency Contact: Polio,Anne, Home Phone: [phone]    DISPOSITION:  Inpatient, transfer to . Expected post-hospitalization disposition will be to home.    AUTHOR:  Baldwin Jamaica, MD  05/19/2018 at 1:02 PM

## 2018-05-19 NOTE — ED Notes
ICU resident states that she wants to hold of on starting the drip, she said to hit the patient with hydralazine first and she is ok with him going to the unit slightly hypertensive

## 2018-05-19 NOTE — Nursing Note
6644- At this time pt arrived on unit via stretcher c/o pain on the way to the bed. Dnx- Polynephritis/ HTN, will follow orders as ordered. Wife at bedisde. Pt cleaned and made comfortable in bed. Educated about plan of care. Pain management and order for fent IV prior to starting drip. Educated about call light and placed within reach. Will continue to monitor.       0500- Pt states pain is now down to 5/10 compared to arrival pain level of 10/10.

## 2018-05-19 NOTE — Nursing Note
0730 Received report from [employer].

## 2018-05-19 NOTE — Consults
CASE MANAGER ASSESSMENT    LACE SCORE:      Admit [id number]    Date of Initial CM Assessment: 05/19/2018    Problems: Active Problems:    Urinary tract infectious disease POA: Yes    Hypertensive urgency POA: Yes       Past Medical History:   Diagnosis Date   ??? Hyperlipidemia 2009   ??? Hypertension 2009   ??? Sleep apnea 2014    Use APAP    Past Surgical History:   Procedure Laterality Date   ??? none          Primary Care Physician:Madan, Marciano Sequin, MD  Phone:[phone]    NEEDS ASSESSMENT:     Level of Function Prior to Admit: Self Care/Indep. W ADLs    Current Living Situation: Lives w/Capable Spouse    Number of medications: 1 to 5       Support Systems: Family members, Spouse/significant other    Contact Name: Hale Bogus (SPOUSE)     AND    MICHAEL Phenix (BROTHER) Phone Number: [phone] - CORRAN LALONE [facility])     AND    [phone] - MICHAEL Ghee (BROTHER)       DPOA?: Other (Comment) (SPOUSE IS SDM)       Living Accommodation: 2-Story   Bathroom on Main Floor: Yes  Stairs in Home: 14     Home Health Services: No             Other Therapies Prior to Admit: None                        DME Owned by Patient: Other (Comment)    Who is your PCP?: Veronda Prude, MD  Do you have your Primary Care Doctor's office number?: Yes  How often do you visit your doctor?: Annual    How will you be transported to your doctor's appointment?: Family transportation, Other (Comment)    Do you need information/education regarding your medical condition?: No       Verbalized financial concerns?: No       Were you hospitalized in the last 30 days?: No     DISCHARGE ASSESSMENT:     Projected Date of Discharge: 05/22/2018    Anticipated Complex D/C?: No    Projected Discharge to: Home    Projected Discharge Needs: Outpatient Therapy    Support Identified at Discharge: Spouse, Other (Comment)  Name of Support Person: Hale Bogus [facility])     AND     MICHAEL Augenstein (BROTHER) Phone Number: [phone] - RUSLAN MCCABE [facility])     AND [phone] - MICHAEL Abrigo (BROTHER)     Who is available to transport you upon discharge?: Family Transportation       Herbie Saxon, RN, BSN, [place] Pager: [phone]  Clinical Case Manager, Department of Care Coordination and Clinical Social Work    Herbie Saxon,  05/19/2018

## 2018-05-20 ENCOUNTER — Ambulatory Visit: Payer: PRIVATE HEALTH INSURANCE

## 2018-05-20 DIAGNOSIS — I4891 Unspecified atrial fibrillation: Secondary | ICD-10-CM

## 2018-05-20 DIAGNOSIS — G4733 Obstructive sleep apnea (adult) (pediatric): Secondary | ICD-10-CM

## 2018-05-20 DIAGNOSIS — N179 Acute kidney failure, unspecified: Secondary | ICD-10-CM

## 2018-05-20 DIAGNOSIS — I10 Essential (primary) hypertension: Secondary | ICD-10-CM

## 2018-05-20 LAB — Differential Automated: ABSOLUTE LYMPHOCYTE COUNT: 1.37 10*3/uL (ref 1.30–3.40)

## 2018-05-20 LAB — Bacterial Culture Urine: BACTERIAL CULTURE URINE: NO GROWTH {titer}

## 2018-05-20 LAB — Calcium,Ionized: IONIZED CA++,UNCORRECTED: 1.02 mmol/L (ref 1.09–1.29)

## 2018-05-20 LAB — Basic Metabolic Panel
CALCIUM: 8.9 mg/dL (ref 8.6–10.4)
GFR ESTIMATE FOR NON-AFRICAN AMERICAN: 37 mL/min/{1.73_m2} (ref 96–106)

## 2018-05-20 LAB — Magnesium: MAGNESIUM: 2.2 meq/L — ABNORMAL HIGH (ref 1.4–1.9)

## 2018-05-20 LAB — CBC: MEAN CORPUSCULAR HEMOGLOBIN: 27.7 pg (ref 26.4–33.4)

## 2018-05-20 LAB — MRSA Surveillance

## 2018-05-20 MED ORDER — RIVAROXABAN 20 MG PO TABS
20 mg | ORAL_TABLET | Freq: Every day | ORAL | 0 refills | 30.00000 days | Status: AC
Start: 2018-05-20 — End: 2018-05-22
  Filled 2018-05-22: qty 30, 30d supply, fill #0

## 2018-05-20 MED ADMIN — METOPROLOL TARTRATE 25 MG PO TABS: 25 mg | ORAL | @ 03:00:00 | Stop: 2018-05-23 | NDC 51079025520

## 2018-05-20 MED ADMIN — TAMSULOSIN HCL 0.4 MG PO CAPS: .4 mg | ORAL | @ 03:00:00 | Stop: 2018-05-23 | NDC 00904640161

## 2018-05-20 MED ADMIN — HYDROMORPHONE HCL 1 MG/ML IJ SOLN: 1 mg | INTRAVENOUS | @ 05:00:00 | Stop: 2018-05-22 | NDC 00409128331

## 2018-05-20 MED ADMIN — LACTATED RINGERS IV SOLN: 75 mL/h | INTRAVENOUS | @ 01:00:00 | Stop: 2018-05-21 | NDC 00338011704

## 2018-05-20 MED ADMIN — LISINOPRIL 10 MG PO TABS: 10 mg | ORAL | @ 15:00:00 | Stop: 2018-05-21 | NDC 60687032501

## 2018-05-20 MED ADMIN — METOPROLOL TARTRATE 25 MG PO TABS: 25 mg | ORAL | @ 15:00:00 | Stop: 2018-05-23 | NDC 51079025520

## 2018-05-20 MED ADMIN — INFLUENZA VACCINE, QUADRIVALENT PF SYR (FLUARIX QUAD): .5 mL | INTRAMUSCULAR | @ 16:00:00 | Stop: 2018-05-20 | NDC 58160089652

## 2018-05-20 MED ADMIN — HYDROMORPHONE HCL 1 MG/ML IJ SOLN: 1 mg | INTRAVENOUS | @ 20:00:00 | Stop: 2018-05-22 | NDC 00409128331

## 2018-05-20 MED ADMIN — POLYETHYLENE GLYCOL 3350 17 G PO PACK: 17 g | ORAL | @ 17:00:00 | Stop: 2018-05-23 | NDC 68084043098

## 2018-05-20 MED ADMIN — CEFTRIAXONE 1 GM/50 ML RTU: 1 g | INTRAVENOUS | @ 04:00:00 | Stop: 2018-05-23 | NDC 00338500241

## 2018-05-20 MED ADMIN — ATORVASTATIN CALCIUM 20 MG PO TABS: 20 mg | ORAL | @ 15:00:00 | Stop: 2018-05-23 | NDC 00904629161

## 2018-05-20 MED ADMIN — OXYCODONE HCL 5 MG PO TABS: 5 mg | ORAL | @ 22:00:00 | Stop: 2018-05-22 | NDC 00406055262

## 2018-05-20 MED ADMIN — INFLUENZA VACCINE, QUADRIVALENT PF SYR (FLUARIX QUAD): .5 mL | INTRAMUSCULAR | @ 04:00:00 | Stop: 2018-05-20

## 2018-05-20 MED ADMIN — OXYCODONE HCL 5 MG PO TABS: 5 mg | ORAL | @ 04:00:00 | Stop: 2018-05-22 | NDC 00406055262

## 2018-05-20 MED ADMIN — OXYCODONE HCL 5 MG PO TABS: 5 mg | ORAL | @ 16:00:00 | Stop: 2018-05-22 | NDC 00406055262

## 2018-05-20 MED ADMIN — SENNOSIDES 8.6 MG PO TABS: 1 | ORAL | @ 17:00:00 | Stop: 2018-05-23 | NDC 00904652261

## 2018-05-20 NOTE — Other
Patients Clinical Goal:   Clinical Goal(s) for the Shift: Pain management, safe and comfortable, VSS  Stability of the patient: Moderately Unstable - medium risk of patient condition declining or worsening    Primary Language: English  Immunizations Needed: Up to date  Other/Significant Events:      Review of Systems  Neuro: Alert and oriented x 4  Resp: Room air, 2L NC prn  Cardiac:Normal sinus rhythm with PACs, HR 80-90's  GI/Endocrine:Nondistended (pt reports bloating and constipation) Last BM Date: 05/18/18    GU: voiding using the urinal, straining urine for stones  Ambulatory Status/Limitations: BMAT 4, ambulatory with steady gait, ambulated in hallways, up in the chair  Skin:No Known Problems  Psychosocial: Calm and cooperative.      Evaluation of Lines/Access  PIV   If PICC, how many cm out? n/a    Procedures/Test/Consults  Done today: Kidney US  Pending: none     Overview of Vitals/Critical Labs  Pain: Pain medication administered for back and right side pain -see MAR  Patient Vitals for the past 12 hrs:   BP Temp Temp src Pulse Resp SpO2   05/20/18 1212 143/83 36.4 C (97.6 F) Oral 77 18 94 %   05/20/18 0925 (!) 164/126 - - 74 - 96 %   05/20/18 0750 (!) 185/93 36.6 C (97.8 F) Oral 78 20 95 %   05/20/18 0447 158/83 36.6 C (97.8 F) Oral 78 18 95 %     Critical Labs: Na 133  Is the patient positive for severe sepsis/septic shock screen?: No     Review of Discharge Planning  Plan/goals: Strain urine for stones, pain management, monitor wbc and creat, BP control  Barriers:None     Teaching Patient  New meds (past 24 hrs): none  Other teaching:  Review of medications, instructed to report pain, reinforced using the urinal   *

## 2018-05-20 NOTE — Consults
[facility]  CONSULT PROGRESS NOTE    PATIENT:  Calvin Chen  MRN:  [id number]  DOB:  February 15, 1956   DATE OF SERVICE:  05/20/2018     SUBJECTIVE:     Denies  ''chest pain, shortness of breath, nausea or vomiting  Pain  well-controlled.    INTERVAL EVENTS:  10/1: Feels better, no overnight events    OBJECTIVE:     24H VITALS:  Temp:  [36.4 ???C (97.6 ???F)-36.8 ???C (98.2 ???F)] 36.6 ???C (97.8 ???F)  Heart Rate:  [63-86] 78   BP: (128-185)/(73-97) 185/93  Resp:  [14-20] 20  SpO2:  [92 %-96 %] 95 %        Intake/Output Summary (Last 24 hours) at 05/20/18 0914  Last data filed at 05/20/18 0700   Gross per 24 hour   Intake             2090 ml   Output             1267 ml   Net              823 ml       PHYSICAL EXAM:  GEN: NAD, A&O x 3  LUNGS: No acute respiratory distress, breathing comfortably without accessory muscle use  ABD: Soft, non-distended, non-tender, no rebound or guarding  GU: voiding,  CVA tendernes NEGATIVE   EXT:  Warm and well perfused, without edema    LAB REVIEW  Lab Results   Component Value Date    WBC 16.81 (H) 05/20/2018    WBC 20.78 (H) 05/19/2018    WBC 15.77 (H) 05/19/2018     Lab Results   Component Value Date    HGB 14.8 05/20/2018    HGB 16.1 05/19/2018    HGB 16.5 05/19/2018     Lab Results   Component Value Date    HCT 44.7 05/20/2018    HCT 48.4 05/19/2018    HCT 48.2 05/19/2018     Lab Results   Component Value Date    PLT 264 05/20/2018     Lab Results   Component Value Date    NA 133 (L) 05/20/2018    K 4.7 05/20/2018    CL 96 05/20/2018    CO2 29 05/20/2018    BUN 24 (H) 05/20/2018     Lab Results   Component Value Date    CREAT 1.95 (H) 05/20/2018    CREAT 1.38 (H) 05/19/2018    CREAT 1.23 05/19/2018     Lab Results   Component Value Date    GLUCOSE 145 (H) 05/20/2018    GLUCOSE 205 (H) 05/19/2018    GLUCOSE 167 (H) 05/19/2018     Lab Results   Component Value Date    CALCIUM 8.9 05/20/2018    MG 2.2 (H) 05/20/2018    PHOS 3.9 05/19/2018       IMAGING  Most recent imaging reviewed ASSESSMENT/PLAN:   Calvin Chen is a 62 y.o. male who is HTN, HLD, and paroxysmal Afib admitted to the ICU with nephrolithiasis and uncontrolled hypertension.  CT ab/pel showing horseshoe kidney with 3mm in right distal ureter with mild hydronephrosis.  ???  Urology Recommendations:   - Continue flomax 0.4mg  daily.  - Aggressive hydration.  - Trial of passage- please have patient strain his urine.  - May need a CT in a few weeks to evaluate if stone is still present.  - Outpatient follow-up with Dr. Lenny Pastel for further stone work-up in  4 weeks  - Continue to trend Cr and WBC  - Please check PVRs/bladder scan to evaluate urinary retention.  - Obtain Renal US to evaluate resolution of hydronephrosis.  - Please page [phone] Urology with any questions    This patient was reviewed with attending physician Dr. Valere Dross, MD/ Lenny Pastel, MD who agrees with the assessment and plan as dictated above.      Author: Azalee Course. Dorene Sorrow, NP 05/20/2018 9:14 AM

## 2018-05-20 NOTE — Nursing Note
Received pt from ICU to [place]. Pt is AOx4, NSR, SpO2 >92% on RA. Denies pain or SOB. 2 RN skin assessment done w/ Thedore Mins, RN. Pt has redness on bilateral elbows. Dinner at bedside.

## 2018-05-20 NOTE — Nursing Note
Break Relief: Medicated with dilaudid 1 mg IV and sent to radiology for kidney ultrasound.

## 2018-05-20 NOTE — Other
Stability of the patient: Moderately Unstable - medium risk of patient condition declining or worsening    Primary Language: English  Immunizations Needed: Influenza pending  Other/Significant Events: none     Review of Systems  Neuro: aaox4  Resp: RA, bipap @ night, only tolerated bipap until about 0200  Cardiac:Normal sinus rhythm  GI/Endocrine:Nondistended Last BM Date: 05/18/18  , 2g Na diet, fair appetite.  GU: voiding via urinal with strainer   Ambulatory Status/Limitations BMAT 4  Skin:Blanchable Redness  Psychosocial: calm, cooperative     Evaluation of Lines/Access  PIV   If PICC, how many cm out? n/a    Procedures/Test/Consults  Done today: none  Pending: none     Overview of Vitals/Critical Labs  Pain: c/o abdominal and back pain due to kidney stone. PRN medications given.    Patient Vitals for the past 12 hrs:   BP Temp Temp src Pulse Resp SpO2   05/20/18 0447 158/83 36.6 C (97.8 F) Oral 78 18 95 %   05/20/18 0045 136/97 - - 68 16 94 %   05/19/18 2354 136/97 36.4 C (97.6 F) Axillary 75 18 96 %   05/19/18 2011 139/79 36.7 C (98 F) Oral 63 17 95 %     Critical Labs: pending  Is the patient positive for severe sepsis/septic shock screen?: No     Review of Discharge Planning  Plan/goals: Pain management, monitor for kidney stone, d/c home when stable  Barriers:None     Teaching Patient  New meds (past 24 hrs): ceftriaxone  Other teaching: fall prevention, pain management, safety, call light use

## 2018-05-20 NOTE — Progress Notes
.    Hospitalist Progress Note  [facility], [facility]    Patient Name Calvin Chen   Patient MRN [id number]   Patient PCP Veronda Prude, MD   Date of Service 05/20/2018   Hospital Day 1   Admission Date 05/19/2018       Chief Complaint  Abdominal Pain (c/o RLQ pain radiating to his lower back that began yesterday with constipation and abdominal distention.)    Interval Events / Subjective / Review of Systems  - still has right lower back pain  - nursing staff straining urine for stones  - renal US is pending  - Denies fevers, chills, nausea, vomiting, chest pain, and shortness of breath.        Medications   Scheduled:    atorvastatin 20 mg Oral Daily   cefTRIAXone 1 g Intravenous Q24H   influenza vaccine injection inpatient 0.5 mL Intramuscular Once   lisinopril 10 mg Oral Daily   metoprolol tartrate 25 mg Oral BID   tamsulosin 0.4 mg Oral QHS      Continuous:  ??? lactated ringers 75 mL/hr (05/19/18 1823)      PRN:  acetaminophen, bisacodyl, HYDROmorphone, labetalol, lactulose, ondansetron, oxyCODONE, polyethylene glycol, senna     Vital Signs Ins and Outs   Temp:  [36.4 ???C (97.6 ???F)-36.8 ???C (98.2 ???F)] 36.6 ???C (97.8 ???F)  Heart Rate:  [63-86] 78  Resp:  [14-19] 18  BP: (128-185)/(73-97) 185/93  NBP Mean:  [88-119] 119  FiO2 (%):  [30 %] 30 %  SpO2:  [92 %-96 %] 95 % I/O last 2 completed shifts:  In: 2490 [P.O.:690; I.V.:1800]  Out: 1467 [Urine:1467]     Physical Examination  Gen: in NAD, comfortable, cooperative, conversant  HEENT: no scleral icterus  Lungs: normal work of breathing, clear to auscultation bilaterally  Heart: regular rate and rhythm, no murmurs  Abd: soft, nondistended, nontender to palpation, mild TTP right perinephritic   Ext: no lower extremity edema bilaterally  Neuro: alert, oriented, face symmetric, no dysarthria  Psych: affect appropriate to context    Labs - Last 24 hours of interval labs personally reviewed and compared to prior values. WBC/Hgb/Hct/Plts:  16.81/14.8/44.7/264 (10/01 5284)  Na/K/Cl/CO2/BUN/Cr/glu:  133/4.7/96/29/24/1.95/145 (10/01 1324)          Imaging - Last 24 hours of interval imaging personally reviewed and compared to prior imaging.  05/19/2018 CT a/p  1.  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  2.  Fluid-filled transverse and ascending colon with multiple foci of luminal narrowing, most compatible with bowel peristalsis. There is also equalization of the distal small bowel and the left abdomen, nonspecific but possibly representing delay in   bowel transit time.  3.  Multiple lesions throughout the liver with suggestion of discontinuous peripheral enhancement, compatible with benign hemangiomas.      Micro - Last 24 hours of interval microbiology data reviewed.      Other Studies - Last 24 hours of interval studies reviewed.      Assessment and Plan  62 year-old male with HTN, HLD and paroxysmal afib admitted to ICU initially for nephrolithiasis and uncontrolled hypertension. Now BP controlled after one dose of labetalol and hydralazine, transferred to Medicine for care    #Hypertension with hypertensive urgency, likely due to pain  - continue home metoprolol and lisinopril  - optimize pain control for kidney stones  - PRN labetalol  -  PRN hydralazine    #Acute kidney injury due to kidney stones, complicated by pyelonephritis on imaging  UA negative, leukocytosis, exam suggestive of pyelonephritis  - Urology - appreciate input  - Strain urine to collect stones  - Pain control  - follow up renal US  - Ceftriaxone (9/30 - )  - Flomax 0.4 mg (monitor for cross reax with sulfa allergy)  - IV hydration  - PVR  - follow urine and blood cultures    #Atrial fibrillation  DOAC on hold due to insurance  - resumed pradaxa   - CM to assist with insurance      Chronic  #HTD - continue atorvastatin #OSA diagnosed 2014 - AHI 26/hr, APAP 12-15. continue BIPAP  #Lung nodule, 5 mm LLL - f/u outpatient  #Liver lesions - possible hemangiomas  #Hiatal hernia  #Left inguinal hernia  #Left iliac vessesl with sclerotic foci, c/w enchondroma      Inpatient Checklist    Diet:       Diet 2 gram sodium  DVT prophylaxis: not indicated, patient ambulatory without risk factors  Central lines: none  Tubes/Drains: none    Code Status: Full Code  Contact:  Primary Emergency Contact: Vanasten,Anne, Home Phone: [phone]    Disposition  Inpatient    I personally reviewed labs and/or microbiologic data, imaging, ECG/telemetry/cardiodiagnostic imaging, reviewed and summarized old records and obtained history from someone other than the patient    Medical decision-making was high risk due to acute or chronic life-threatening problem, specifically hypertensive urgency and pyelonephritis      Lin Givens MD [facility]  Attending Physician  [facility] Service  05/20/2018 8:04 AM

## 2018-05-21 LAB — CBC: NUCLEATED RBC%, AUTOMATED: 0 (ref 4.16–9.95)

## 2018-05-21 LAB — Calcium,Ionized: IONIZED CA++,UNCORRECTED: 1.05 mmol/L (ref 1.09–1.29)

## 2018-05-21 LAB — Basic Metabolic Panel
ANION GAP: 10 mmol/L (ref 8–19)
CHLORIDE: 96 mmol/L (ref 96–106)

## 2018-05-21 LAB — Differential Automated: ABSOLUTE EOS COUNT: 0.01 10*3/uL (ref 0.00–0.50)

## 2018-05-21 LAB — Magnesium: MAGNESIUM: 2 meq/L — ABNORMAL HIGH (ref 1.4–1.9)

## 2018-05-21 MED ORDER — APIXABAN 5 MG PO TABS
5 mg | ORAL_TABLET | Freq: Two times a day (BID) | ORAL | 3 refills | 30.00 days | Status: AC
Start: 2018-05-21 — End: 2018-06-19
  Filled 2018-05-23: qty 60, 30d supply, fill #0

## 2018-05-21 MED ADMIN — OXYCODONE HCL 5 MG PO TABS: 5 mg | ORAL | @ 16:00:00 | Stop: 2018-05-22 | NDC 00406055262

## 2018-05-21 MED ADMIN — OXYCODONE HCL 5 MG PO TABS: 5 mg | ORAL | @ 20:00:00 | Stop: 2018-05-22 | NDC 00406055262

## 2018-05-21 MED ADMIN — METOPROLOL TARTRATE 25 MG PO TABS: 25 mg | ORAL | @ 04:00:00 | Stop: 2018-05-23 | NDC 51079025520

## 2018-05-21 MED ADMIN — OXYCODONE HCL 5 MG PO TABS: 5 mg | ORAL | @ 08:00:00 | Stop: 2018-05-22 | NDC 00406055262

## 2018-05-21 MED ADMIN — CEFTRIAXONE 1 GM/50 ML RTU: 1 g | INTRAVENOUS | @ 04:00:00 | Stop: 2018-05-23 | NDC 00338500241

## 2018-05-21 MED ADMIN — HYDROMORPHONE HCL 1 MG/ML IJ SOLN: 1 mg | INTRAVENOUS | @ 10:00:00 | Stop: 2018-05-22 | NDC 00409128331

## 2018-05-21 MED ADMIN — TAMSULOSIN HCL 0.4 MG PO CAPS: .4 mg | ORAL | @ 04:00:00 | Stop: 2018-05-23 | NDC 00904640161

## 2018-05-21 MED ADMIN — LISINOPRIL 5 MG PO TABS: 10 mg | ORAL | @ 19:00:00 | Stop: 2018-05-21 | NDC 68084019601

## 2018-05-21 MED ADMIN — DABIGATRAN ETEXILATE MESYLATE 150 MG PO CAPS: 150 mg | ORAL | @ 06:00:00 | Stop: 2018-05-23 | NDC 00597036082

## 2018-05-21 MED ADMIN — OXYCODONE HCL 5 MG PO TABS: 5 mg | ORAL | @ 03:00:00 | Stop: 2018-05-22 | NDC 00406055262

## 2018-05-21 MED ADMIN — DABIGATRAN ETEXILATE MESYLATE 150 MG PO CAPS: 150 mg | ORAL | @ 17:00:00 | Stop: 2018-05-23 | NDC 00597036082

## 2018-05-21 MED ADMIN — ATORVASTATIN CALCIUM 20 MG PO TABS: 20 mg | ORAL | @ 16:00:00 | Stop: 2018-05-23 | NDC 00904629161

## 2018-05-21 MED ADMIN — METOPROLOL TARTRATE 25 MG PO TABS: 25 mg | ORAL | @ 16:00:00 | Stop: 2018-05-23 | NDC 51079025520

## 2018-05-21 MED ADMIN — BISACODYL 5 MG PO TBEC: 5 mg | ORAL | @ 22:00:00 | Stop: 2018-05-23 | NDC 00904640761

## 2018-05-21 MED ADMIN — LACTATED RINGERS IV SOLN: 75 mL/h | INTRAVENOUS | @ 01:00:00 | Stop: 2018-05-21 | NDC 00338011704

## 2018-05-21 MED ADMIN — LISINOPRIL 10 MG PO TABS: 10 mg | ORAL | @ 16:00:00 | Stop: 2018-05-21 | NDC 60687032501

## 2018-05-21 MED ADMIN — HYDROMORPHONE HCL 1 MG/ML IJ SOLN: 1 mg | INTRAVENOUS | @ 23:00:00 | Stop: 2018-05-22 | NDC 00409128331

## 2018-05-21 MED ADMIN — LACTATED RINGERS IV SOLN: 75 mL/h | INTRAVENOUS | @ 14:00:00 | Stop: 2018-05-21 | NDC 00338011704

## 2018-05-21 NOTE — Progress Notes
Introduced myself to the patient and offered chaplaincy services.  He denied any spiritual need and concern. Spoke a word of thanks and blessing to him.

## 2018-05-21 NOTE — Other
Patients Clinical Goal:   Clinical Goal(s) for the Shift: Pain management ,VSS, Comort and Safety   Identify possible barriers to advancing the care plan: none  Stability of the patient: Moderately Unstable - medium risk of patient condition declining or worsening    End of Shift Summary:      Blood pressure 118/73, pulse 78, temperature 36.5 C (97.7 F), temperature source Axillary, resp. rate 20, height 1.702 m (5' 7.01''), weight 93.9 kg (207 lb 0.2 oz), SpO2 94 %.     Neuro: Alert and oriented x4. Pt presented calm and cooperative during the shift   Resp: Lung clear upon ausculation bilaterally. Pt denies SOB. Pt uses home CPAP machine a night ( consent signed) and 2L NC PRN during the day   Cardiac: NSR on the monitor upon ambulation and movement. No distress noted  Gi/Endocrine:  R side of abdomen tender to touch upon palpation  with radiating pain. No BM during the shift. Pain medication given to alleviate the pain   GU: Voiding well. No stones passed  Mobility: BMAT 3, pt ambulated around the unit  Skin: No skin impairments noted  Psych: pt presented calm  during the shift

## 2018-05-21 NOTE — Progress Notes
.    Hospitalist Progress Note  [facility], [facility]    Patient Name Calvin Chen   Patient MRN [id number]   Patient PCP Veronda Prude, MD   Date of Service 05/21/2018   Hospital Day 2   Admission Date 05/19/2018       Chief Complaint  Abdominal Pain (c/o RLQ pain radiating to his lower back that began yesterday with constipation and abdominal distention.)    Interval Events / Subjective / Review of Systems  - pain is better controlled, better appetite, ate banana and cereal  - no BM for 3 days, on bowel regimen, but has not eaten any real meals until this morning  - weaning off IV dilaudid  - renal US no stone shadows  - Denies fevers, chills, nausea, vomiting, chest pain, and shortness of breath.        Medications   Scheduled:    atorvastatin 20 mg Oral Daily   cefTRIAXone 1 g Intravenous Q24H   dabigatran 150 mg Oral BID   lisinopril 10 mg Oral Once   [START ON 05/22/2018] lisinopril 20 mg Oral Daily   metoprolol tartrate 25 mg Oral BID   tamsulosin 0.4 mg Oral QHS      Continuous:     PRN:  acetaminophen, bisacodyl, HYDROmorphone, lactulose, ondansetron, oxyCODONE, polyethylene glycol, senna     Vital Signs Ins and Outs   Temp:  [36.3 ???C (97.4 ???F)-37.5 ???C (99.5 ???F)] 36.3 ???C (97.4 ???F)  Heart Rate:  [74-87] 87  Resp:  [16-20] 20  BP: (118-184)/(73-97) 184/97  NBP Mean:  [84-123] 123  SpO2:  [94 %-95 %] 95 % I/O last 2 completed shifts:  In: 906.8 [P.O.:118; I.V.:788.8]  Out: 3150 [Urine:3150]     Physical Examination  Gen: in NAD, comfortable, cooperative, conversant  HEENT: no scleral icterus  Lungs: normal work of breathing, clear to auscultation bilaterally  Heart: regular rate and rhythm, no murmurs  Abd: soft, nondistended, nontender to palpation, no CVAT  Ext: no lower extremity edema bilaterally  Neuro: alert, oriented, face symmetric, no dysarthria  Psych: affect appropriate to context    Labs - Last 24 hours of interval labs personally reviewed and compared to prior values. WBC/Hgb/Hct/Plts:  12.41/14.2/43.6/245 (10/02 2956)  Na/K/Cl/CO2/BUN/Cr/glu:  136/4.5/96/30/23/1.91/132 (10/02 2130)          Imaging - Last 24 hours of interval imaging personally reviewed and compared to prior imaging.  05/19/2018 CT a/p  1.  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  2.  Fluid-filled transverse and ascending colon with multiple foci of luminal narrowing, most compatible with bowel peristalsis. There is also equalization of the distal small bowel and the left abdomen, nonspecific but possibly representing delay in   bowel transit time.  3.  Multiple lesions throughout the liver with suggestion of discontinuous peripheral enhancement, compatible with benign hemangiomas.      Micro - Last 24 hours of interval microbiology data reviewed.      Other Studies - Last 24 hours of interval studies reviewed.      Assessment and Plan  62 year-old male with HTN, HLD and paroxysmal afib admitted to ICU initially for nephrolithiasis and uncontrolled hypertension. Now BP controlled after one dose of labetalol and hydralazine, transferred to Medicine for care    #Hypertension with hypertensive urgency, likely due to pain  - increase lisinopril to 20 mg daily  - continue  home metoprolol   - optimize pain control for kidney stones  - PRN labetalol  - PRN hydralazine    #Acute kidney injury due to kidney stones, complicated by pyelonephritis on imaging  UA negative, leukocytosis, exam suggestive of pyelonephritis  Renal US negative for stone shadows  - Urology - appreciate input  - Strain urine to collect stones  - Pain control  - ceftriaxone 9/30 -   - flomax 0.4 mg daily  - IV hydration  - PVR  - follow urine and blood cultures  - use apixaban for afib AC given renal function    #Atrial fibrillation  DOAC on hold due to insurance - prefer apixaban given renal function - CM to assist with insurance    Chronic  #HTD - continue atorvastatin  #OSA diagnosed 2014 - AHI 26/hr, APAP 12-15. continue BIPAP  #Lung nodule, 5 mm LLL - f/u outpatient  #Liver lesions - possible hemangiomas  #Hiatal hernia  #Left inguinal hernia  #Left iliac vessesl with sclerotic foci, c/w enchondroma      Inpatient Checklist    Diet:       Diet 2 gram sodium  DVT prophylaxis: apixaban  Central lines: none  Tubes/Drains: none    Code Status: Full Code  Contact:  Primary Emergency Contact: Nordahl,Anne, Home Phone: [phone]    Disposition  Inpatient    I personally reviewed labs and/or microbiologic data, imaging, ECG/telemetry/cardiodiagnostic imaging, reviewed and summarized old records and obtained history from someone other than the patient    Medical decision-making was high risk due to acute or chronic life-threatening problem, specifically requiring IV pain medications for nephrolithiasis    Lin Givens MD [facility]  Attending Physician  [facility] Service  05/21/2018 11:22 AM

## 2018-05-21 NOTE — Consults
[place] Department of Urology  CONSULT PROGRESS NOTE    PATIENT:  Calvin Chen  MRN:  [id number]  DOB:  [birth year]   DATE OF SERVICE:  05/21/2018     SUBJECTIVE:     Denies  ''chest pain, shortness of breath, nausea or vomiting  Pain  well-controlled.    INTERVAL EVENTS:  10/1: Feels better, no overnight events  10/2: Back pain improved    OBJECTIVE:     24H VITALS:  Temp:  [36.3 ???C (97.4 ???F)-37.5 ???C (99.5 ???F)] 36.3 ???C (97.4 ???F)  Heart Rate:  [74-87] 87   BP: (118-184)/(73-126) 184/97  Resp:  [16-20] 20  SpO2:  [94 %-96 %] 95 %        Intake/Output Summary (Last 24 hours) at 05/21/18 4540  Last data filed at 05/21/18 0545   Gross per 24 hour   Intake           906.75 ml   Output             3150 ml   Net         -2243.25 ml       PHYSICAL EXAM:  GEN: NAD, A&O x 3  LUNGS: No acute respiratory distress, breathing comfortably without accessory muscle use  ABD: Soft, non-distended, non-tender, no rebound or guarding  GU: voiding,  CVA tendernes NEGATIVE   EXT:  Warm and well perfused, without edema    LAB REVIEW  Lab Results   Component Value Date    WBC 16.81 (H) 05/20/2018    WBC 20.78 (H) 05/19/2018    WBC 15.77 (H) 05/19/2018     Lab Results   Component Value Date    HGB 14.8 05/20/2018    HGB 16.1 05/19/2018    HGB 16.5 05/19/2018     Lab Results   Component Value Date    HCT 44.7 05/20/2018    HCT 48.4 05/19/2018    HCT 48.2 05/19/2018     Lab Results   Component Value Date    PLT 264 05/20/2018     Lab Results   Component Value Date    NA 133 (L) 05/20/2018    K 4.7 05/20/2018    CL 96 05/20/2018    CO2 29 05/20/2018    BUN 24 (H) 05/20/2018     Lab Results   Component Value Date    CREAT 1.95 (H) 05/20/2018    CREAT 1.38 (H) 05/19/2018    CREAT 1.23 05/19/2018     Lab Results   Component Value Date    GLUCOSE 145 (H) 05/20/2018    GLUCOSE 205 (H) 05/19/2018    GLUCOSE 167 (H) 05/19/2018     Lab Results   Component Value Date    CALCIUM 8.9 05/20/2018    MG 2.2 (H) 05/20/2018    PHOS 3.9 05/19/2018       IMAGING Most recent imaging reviewed  05/20/2018 RENAL US  IMPRESSION:  Mild dilation of the right collecting system. No shadowing stones seen.    ASSESSMENT/PLAN:   Calvin Chen is a 62 y.o. male who is HTN, HLD, and paroxysmal Afib admitted to the ICU with nephrolithiasis and uncontrolled hypertension.  CT ab/pel showing horseshoe kidney with 3mm in right distal ureter with mild hydronephrosis.  ???  Urology Recommendations:   - Continue flomax 0.4mg  daily.  - Aggressive hydration.  - Trial of passage- please have patient strain his urine.  - May need a CT in a few weeks to evaluate  if stone is still present.  - Outpatient follow-up with Dr. Lenny Pastel for further stone work-up in 4 weeks  - Continue to trend Cr and WBC  - Please check PVRs/bladder scan to evaluate urinary retention.  - Please page [phone] Urology with any questions    This patient was reviewed with attending physician Dr. Valere Dross, MD/ Lenny Pastel, MD who agrees with the assessment and plan as dictated above.      Author: Azalee Course. Dorene Sorrow, NP 05/21/2018 8:22 AM

## 2018-05-21 NOTE — Progress Notes
NUTRITION IN-DEPTH SCREEN (Adult)    Admit Date: 05/19/2018     Date of Birth: 04-Jan-1956 Gender: male MRN: [id number]     Date of Screening 05/20/2018   Subjective:   Pt with poor appetite and intake. His last proper meal was on Sunday morning. He states that he will try to order his lunch meal and consume it. He has intentionally lost weight by following a calorie restricted diet and exercise. He endorses constipation and reports straining physically while passing a bowel. Pt denies any food allergies and follows a Regular Diet at home. Currently declining ONS offered at time of visit.      Problems: Active Problems:    Urinary tract infectious disease POA: Yes    Hypertensive urgency POA: Yes       Past Medical History:   Diagnosis Date   ??? Hyperlipidemia 2009   ??? Hypertension 2009   ??? Sleep apnea 2014    Use APAP    Past Surgical History:   Procedure Laterality Date   ??? none           Anthropometrics     Admit  Height: 170.2 cm (5' 7'')  Weight: 93.9 kg (207 lb) Most Recent  Height: 170.2 cm (5' 7.01'') (Per chart )  Weight: 93.9 kg (207 lb 0.2 oz) (Per chart )  IBW: 67.1 kg (148 lb)  Usual Weight: 93.9 kg (207 lb) (Per pt )           BMI (Calculated): 32.5    % Ideal Body Weight: 140 %  % Usual Weight: 100 %       Wt Readings from Last 20 Encounters:   05/20/18 93.9 kg (207 lb 0.2 oz)   04/08/18 98 kg (216 lb)   01/20/18 94.8 kg (209 lb)      Allergies   Sulfa antibiotics and Naproxen     Cultural / Religious / Ethnic Food Preferences   None       Nutrition Prior to Admission   Regular Diet      Nutrition Risk Factors      Acuity Level: 1-No risk identified      Diet Orders     Diets/Supplements/Feeds   Diet    Diet 2 gram sodium     Start Date/Time: 05/19/18 1010     Number of Occurrences:  Until Specified      Impression   PO % consumed: 0 to 25% (x1), 26-50% (x1)  Impression: Intake limited by poor appetite, Intake of meals/snacks not adequate, May benefit with a nutritional supplement, Pt may benefit from assistance with menu or meals, Weight decreasing per chart and per pt, Obese Class I per BMI, Denies N/V/D, Endorses constipation, Last BM on 05/18/2018     Diet Education   Teaching provided (Refer to Patient Education records)      FDI Target Drugs: No        Nutrition Care Plan   Plan: Continue with diet as ordered, Trend weights, Monitor adequacy of intake, Monitor tolerance to diet, Encouraged intake of meals, provided tips to improve intake, Follow up on education provided for further education needs, Will provide ONS on pts request      Next Follow-up by 05/25/18    Author:  Aggie Hacker, DTR, pager [phone]  05/20/2018 6:29 PM

## 2018-05-22 ENCOUNTER — Telehealth: Payer: PRIVATE HEALTH INSURANCE

## 2018-05-22 ENCOUNTER — Telehealth: Payer: BLUE CROSS/BLUE SHIELD

## 2018-05-22 LAB — Basic Metabolic Panel
SODIUM: 133 mmol/L — ABNORMAL LOW (ref 135–146)
SODIUM: 133 mmol/L — ABNORMAL LOW (ref 135–146)
TOTAL CO2: 27 mmol/L (ref 20–30)

## 2018-05-22 LAB — Differential Automated: LYMPHOCYTE PERCENT, AUTO: 9.7 (ref 0.00–0.10)

## 2018-05-22 LAB — Glucose,POC: GLUCOSE,POC: 155 mg/dL — ABNORMAL HIGH (ref 65–99)

## 2018-05-22 LAB — Calcium,Ionized: IONIZED CA++,UNCORRECTED: 1.06 mmol/L (ref 1.09–1.29)

## 2018-05-22 LAB — CBC: NUCLEATED RBC%, AUTOMATED: 0 (ref 143–398)

## 2018-05-22 LAB — Magnesium: MAGNESIUM: 2 meq/L — ABNORMAL HIGH (ref 1.4–1.9)

## 2018-05-22 MED ORDER — DABIGATRAN ETEXILATE MESYLATE 150 MG PO CAPS
150 mg | Freq: Two times a day (BID) | ORAL
Start: 2018-05-22 — End: 2018-05-23

## 2018-05-22 MED ORDER — CIPROFLOXACIN HCL 500 MG PO TABS
500 mg | ORAL_TABLET | Freq: Two times a day (BID) | ORAL | 0 refills | 3.00 days | Status: AC
Start: 2018-05-22 — End: ?
  Filled 2018-05-23: qty 6, 3d supply, fill #0

## 2018-05-22 MED ORDER — POLYETHYLENE GLYCOL 3350 17 GM/SCOOP PO POWD
17 g | Freq: Every day | ORAL | 0 refills | 30.00 days | Status: AC | PRN
Start: 2018-05-22 — End: ?
  Filled 2018-05-23: qty 510, 30d supply, fill #0

## 2018-05-22 MED ORDER — OXYCODONE HCL 5 MG PO TABS
5 mg | ORAL_TABLET | ORAL | 0 refills | 5.00000 days | Status: AC | PRN
Start: 2018-05-22 — End: ?

## 2018-05-22 MED ORDER — DABIGATRAN ETEXILATE MESYLATE 150 MG PO CAPS
150 mg | ORAL_CAPSULE | Freq: Two times a day (BID) | ORAL | 0 refills | Status: AC
Start: 2018-05-22 — End: 2018-05-23

## 2018-05-22 MED ORDER — LISINOPRIL 20 MG PO TABS
20 mg | ORAL_TABLET | Freq: Every day | ORAL | 0 refills | 30.00 days | Status: AC
Start: 2018-05-22 — End: ?
  Filled 2018-05-23: qty 30, 30d supply, fill #0

## 2018-05-22 MED ORDER — SENNOSIDES 8.6 MG PO TABS
1 | ORAL_TABLET | Freq: Two times a day (BID) | ORAL | 0 refills | 15.00000 days | Status: AC | PRN
Start: 2018-05-22 — End: ?
  Filled 2018-05-23: qty 30, 15d supply, fill #0

## 2018-05-22 MED ORDER — NIFEDIPINE 20 MG PO CAPS
20 mg | ORAL_CAPSULE | Freq: Three times a day (TID) | ORAL | 0 refills | Status: AC
Start: 2018-05-22 — End: ?

## 2018-05-22 MED ORDER — NALOXONE HCL 4 MG/0.1ML NA LIQD
1 refills | 1.00 days | Status: AC
Start: 2018-05-22 — End: 2018-05-27
  Filled 2018-05-23: qty 2, 1d supply, fill #0

## 2018-05-22 MED ORDER — OXYCODONE HCL 5 MG PO TABS
5 mg | ORAL_TABLET | ORAL | 0 refills | PRN
Start: 2018-05-22 — End: 2018-05-23
  Filled 2018-05-23: qty 30, 5d supply, fill #0

## 2018-05-22 MED ORDER — TAMSULOSIN HCL 0.4 MG PO CAPS
.4 mg | ORAL_CAPSULE | Freq: Every evening | ORAL | 0 refills | 30.00 days | Status: AC
Start: 2018-05-22 — End: ?
  Filled 2018-05-23: qty 30, 30d supply, fill #0

## 2018-05-22 MED ADMIN — LISINOPRIL 20 MG PO TABS: 20 mg | ORAL | @ 15:00:00 | Stop: 2018-05-22 | NDC 60687033301

## 2018-05-22 MED ADMIN — METOPROLOL TARTRATE 25 MG PO TABS: 25 mg | ORAL | @ 03:00:00 | Stop: 2018-05-23 | NDC 51079025520

## 2018-05-22 MED ADMIN — FLEET ENEMA 7-19 GM/118ML RE ENEM: 1 | RECTAL | @ 20:00:00 | Stop: 2018-05-23 | NDC 00132020140

## 2018-05-22 MED ADMIN — NIFEDIPINE 10 MG PO CAPS: 20 mg | ORAL | @ 19:00:00 | Stop: 2018-05-23 | NDC 59762100401

## 2018-05-22 MED ADMIN — OXYCODONE HCL 5 MG PO TABS: 15 mg | ORAL | @ 05:00:00 | Stop: 2018-05-22 | NDC 00406055262

## 2018-05-22 MED ADMIN — DABIGATRAN ETEXILATE MESYLATE 150 MG PO CAPS: 150 mg | ORAL | @ 05:00:00 | Stop: 2018-05-23 | NDC 00597036082

## 2018-05-22 MED ADMIN — OXYCODONE HCL 5 MG PO TABS: 15 mg | ORAL | @ 11:00:00 | Stop: 2018-05-22 | NDC 00406055262

## 2018-05-22 MED ADMIN — ATORVASTATIN CALCIUM 20 MG PO TABS: 20 mg | ORAL | @ 15:00:00 | Stop: 2018-05-23 | NDC 00904629161

## 2018-05-22 MED ADMIN — METOPROLOL TARTRATE 25 MG PO TABS: 25 mg | ORAL | @ 15:00:00 | Stop: 2018-05-23 | NDC 51079025520

## 2018-05-22 MED ADMIN — LACTULOSE 10 GM/15ML PO SOLN: 30 g | ORAL | @ 15:00:00 | Stop: 2018-05-23 | NDC 00121115400

## 2018-05-22 MED ADMIN — OXYCODONE HCL 5 MG PO TABS: 5 mg | ORAL | @ 18:00:00 | Stop: 2018-05-23 | NDC 00406055262

## 2018-05-22 MED ADMIN — ACETAMINOPHEN 325 MG PO TABS: 650 mg | ORAL | @ 18:00:00 | Stop: 2018-05-23 | NDC 50580060002

## 2018-05-22 MED ADMIN — HYDROMORPHONE HCL 1 MG/ML IJ SOLN: 1 mg | INTRAVENOUS | @ 03:00:00 | Stop: 2018-05-22 | NDC 00409128331

## 2018-05-22 MED ADMIN — ACETAMINOPHEN 325 MG PO TABS: 650 mg | ORAL | @ 15:00:00 | Stop: 2018-05-23 | NDC 50580060002

## 2018-05-22 MED ADMIN — CEFTRIAXONE 1 GM/50 ML RTU: 1 g | INTRAVENOUS | @ 05:00:00 | Stop: 2018-05-23 | NDC 00338500241

## 2018-05-22 MED ADMIN — DABIGATRAN ETEXILATE MESYLATE 150 MG PO CAPS: 150 mg | ORAL | @ 15:00:00 | Stop: 2018-05-23 | NDC 00597036082

## 2018-05-22 MED ADMIN — TAMSULOSIN HCL 0.4 MG PO CAPS: .4 mg | ORAL | @ 03:00:00 | Stop: 2018-05-23 | NDC 00904640161

## 2018-05-22 NOTE — Other
Patients Clinical Goal:   Clinical Goal(s) for the Shift: pain management, stable vS  Stability of the patient: Moderately Unstable - medium risk of patient condition declining or worsening    Primary Language: English  Immunizations Needed: Up to date  Other/Significant Events:      Review of Systems  Neuro: Alert and oriented x 4, pleasant and cooperative.  Resp: Room air, prn 2L NC  Cardiac:Normal sinus rhythm, HR  60-90's, BP wnl  GI/Endocrine:Soft Last BM Date: 05/18/18    GU: Voiding using the urinal, no kidney stones passed.  Ambulatory Status/Limitations: BMAT 4, steady gait. Ambulated in hallways with RN. Up in the chair most of the shift.  Skin:No Known Problems  Psychosocial: Calm and cooperative. Wife visited this afternoon.     Evaluation of Lines/Access  PIV   If PICC, how many cm out? n/a    Procedures/Test/Consults  Done today: none  Pending:      Overview of Vitals/Critical Labs  Pain: IV dilaudid and Oxycodone PO given to patient for pain control.  Patient Vitals for the past 12 hrs:   BP Temp Temp src Pulse Resp SpO2 Weight   05/21/18 1602 144/85 37.1 C (98.8 F) Oral 82 18 93 % -   05/21/18 1148 165/97 36.6 C (97.8 F) Oral 68 18 95 % -   05/21/18 0800 (!) 184/97 36.3 C (97.4 F) Oral 87 - 95 % -   05/21/18 0632 - - - - - - 95.2 kg (209 lb 14.1 oz)     Critical Labs: WBC trending down  Is the patient positive for severe sepsis/septic shock screen?: No     Review of Discharge Planning  Plan/goals: Continue home medications,  pradaxa may changed to apixaban, possibly d/c home and outpatient follow up.  Barriers:None     Teaching Patient  New meds (past 24 hrs): none  Other teaching: Safety, reporting pain.    Downgraded to 3NW, awaiting for the bed, then transport.

## 2018-05-22 NOTE — Other
Patients Clinical Goal:   Clinical Goal(s) for the Shift: Pt will remain safe and free of falls and injury, pain will be controled, vss  Identify possible barriers to advancing the care plan: none  Stability of the patient: Moderately Stable - low risk of patient condition declining or worsening   End of Shift Summary: pt alert and oriented x 4.   Pt c/o intermittent abd pain throughout the shift.  Medicated with Dilaudid IV x 1 and oxycodone 15 mg po x 2.   Pt educated on taking po prior to Iv pain medications.   Pt states he has not had a bm since 9/29.  Pt took ducolox tab yesterday afternoon.   Attempted to persuaded pt to take another laxative this am but pt declined.  Pt urine strained this shift with no sign of stone. Urine clear and yellow.  Pt slept with his own cpap over night.  VSS.  Pt remained safe and free of falls and injury.   Will report plan of care to oncoming shift.

## 2018-05-22 NOTE — Consults
[place] Department of Urology  CONSULT PROGRESS NOTE    PATIENT:  Calvin Chen  MRN:  [id number]  DOB:  1955-08-27   DATE OF SERVICE:  05/22/2018     SUBJECTIVE:     Denies  ''chest pain, shortness of breath, nausea or vomiting  Pain  well-controlled.    INTERVAL EVENTS:  10/1: Feels better, no overnight events  10/2: Back pain improved    OBJECTIVE:     24H VITALS:  Temp:  [36.1 ???C (97 ???F)-37.1 ???C (98.8 ???F)] 36.3 ???C (97.3 ???F)  Heart Rate:  [66-89] 72   BP: (117-192)/(81-105) 146/95  Resp:  [16-20] 16  SpO2:  [93 %-96 %] 96 %        Intake/Output Summary (Last 24 hours) at 05/22/18 0845  Last data filed at 05/22/18 0500   Gross per 24 hour   Intake             1385 ml   Output             2200 ml   Net             -815 ml       PHYSICAL EXAM:  GEN: NAD, A&O x 3  LUNGS: No acute respiratory distress, breathing comfortably without accessory muscle use  ABD: Soft, non-distended, non-tender, no rebound or guarding  GU: voiding,  CVA tendernes NEGATIVE   EXT:  Warm and well perfused, without edema    LAB REVIEW  Lab Results   Component Value Date    WBC 11.11 (H) 05/22/2018    WBC 12.41 (H) 05/21/2018    WBC 16.81 (H) 05/20/2018     Lab Results   Component Value Date    HGB 13.0 (L) 05/22/2018    HGB 14.2 05/21/2018    HGB 14.8 05/20/2018     Lab Results   Component Value Date    HCT 40.1 05/22/2018    HCT 43.6 05/21/2018    HCT 44.7 05/20/2018     Lab Results   Component Value Date    PLT 273 05/22/2018     Lab Results   Component Value Date    NA 133 (L) 05/22/2018    K 4.3 05/22/2018    CL 95 (L) 05/22/2018    CO2 27 05/22/2018    BUN 24 (H) 05/22/2018     Lab Results   Component Value Date    CREAT 1.91 (H) 05/22/2018    CREAT 1.91 (H) 05/21/2018    CREAT 1.95 (H) 05/20/2018     Lab Results   Component Value Date    GLUCOSE 136 (H) 05/22/2018    GLUCOSE 132 (H) 05/21/2018    GLUCOSE 145 (H) 05/20/2018     Lab Results   Component Value Date    CALCIUM 8.5 (L) 05/22/2018    MG 2.0 (H) 05/22/2018    PHOS 3.9 05/19/2018 IMAGING  Most recent imaging reviewed  05/20/2018 RENAL US  IMPRESSION:  Mild dilation of the right collecting system. No shadowing stones seen.    ASSESSMENT/PLAN:   Tito Ausmus is a 62 y.o. male who is HTN, HLD, and paroxysmal Afib admitted to the ICU with nephrolithiasis and uncontrolled hypertension.  CT ab/pel showing horseshoe kidney with 3mm in right distal ureter with mild hydronephrosis.  ???  Urology Recommendations:   - Continue flomax 0.4mg  daily.  - Aggressive hydration.  - Trial of passage- please have patient strain his urine.  - May need a CT in  a few weeks to evaluate if stone is still present.  - Outpatient follow-up with Dr. Lenny Pastel for further stone work-up in 4 weeks  - Continue to trend Cr and WBC  - Please check PVRs/bladder scan if concern with urinary retention.  - From Urology's standpoint, patient is stable for discharge.  - Please provide strict return to ED precaution, I.e. S/s infection, n/v, etc.  - Please page [phone] Urology with any questions    This patient was reviewed with attending physician Dr. Valere Dross, MD/ Lenny Pastel, MD who agrees with the assessment and plan as dictated above.      Author: Azalee Course. Dorene Sorrow, NP 05/22/2018 8:45 AM

## 2018-05-22 NOTE — Telephone Encounter
[facility] Hospitalist Navigator - Rm [phone] #1    Spoke with patient and confirmed scheduled discharge hospital follow up appointments - PMD, Urology.  Handed patient appointment confirmation letters    Confirmed demographics and contact information on file

## 2018-05-22 NOTE — Telephone Encounter
Patient Calvin Chen [id number] has been scheduled for the following:    1.) PCP  Dr. Dorothy Puffer on Oct 7th @9 :30am  [address] [place]  [place], [place]    2.) Uro  Dr. Meridee Score on Oct 18th @10am   [place], [place]  (scheduled with Belenda Cruise)    Per navigator: Spoke with patient and confirmed scheduled discharge hospital follow up appointments - PMD, Urology.  Handed patient appointment confirmation letters    Leroy Libman  Hospitalist services

## 2018-05-22 NOTE — Progress Notes
Pharmaceutical Services ??? Discharge Medication Reconciliation and Counseling Note    Patient Name: Calvin Chen  Medical Record Number: [id number]  Admit date: 05/19/2018 4:00 AM      Age: 62 y.o.  Sex: male  Allergies: Sulfa antibiotics and Naproxen    Preferred Pharmacy:   [facility] - [place], [place] - [address]  [address]  [place] [address]        I reconciled the discharge medications and counseled the patient on the relevant discharge medications. Discharge prescriptions were delivered to bedside. The purpose, potential side effects, storage, missed doses and any special instructions related to each medication was discussed.    Discharge Medication List from AVS:     Changes To My Medications      START taking these medications      Dose Instructions   ciprofloxacin 500 mg tablet  Commonly known as:  Cipro  Start taking on:  05/23/2018   500 mg   Take 1 tablet (500 mg total) by mouth every twelve (12) hours for 3 days.     oxyCODONE 5 mg tablet   5 mg   Take 1 tablet (5 mg total) by mouth every four (4) hours as needed for Moderate Pain (Pain Scale 4-6). Max Daily Amount: 30 mg     polyethylene glycol powder  Commonly known as:  Glycolax  Notes to patient:  DO NOT TAKE IF HAVING LOOSE STOOLS   17 g   Take 17 g by mouth daily as needed (constipation). Dissolve in 6-8 oz water, juice, tea, beverage of choice.     SENNA LAXATIVE 8.6 mg tablet  Generic drug:  senna   1 tablet   Take 1 tablet by mouth two (2) times daily as needed for Constipation.     tamsulosin 0.4 mg capsule  Commonly known as:  Flomax   0.4 mg   Take 1 capsule (0.4 mg total) by mouth at bedtime.        CHANGE how you take these medications      Dose Instructions   lisinopril 20 mg tablet  Commonly known as:  Prinivil,Zestril  Start taking on:  05/23/2018  What changed:  ??? medication strength  ??? how much to take   20 mg   Take 1 tablet (20 mg total) by mouth daily. These medications were sent to [facility] ([place]) [phone] [address], [place] [address]    Hours:  Mon-Fri 8AM-6PM, Holidays 8AM-5PM (Closed 12PM-1PM for lunch); Closed Sat & Sun Phone:  [phone]   ??? ciprofloxacin 500 mg tablet  ??? lisinopril 20 mg tablet  ??? Naloxone HCl 4 MG/0.1ML Liqd- pt refused due to high copay  ??? oxyCODONE 5 mg tablet  ??? polyethylene glycol powder  ??? SENNA LAXATIVE 8.6 mg tablet  ??? tamsulosin 0.4 mg capsule       Home meds not covered due to time constraints  Melida Quitter, 05/22/2018, 4:18 PM

## 2018-05-22 NOTE — Nursing Note
Hand-off report given to Barnett Applebaum, Therapist, sports. Pt will be transferred to [facility].  Awaiting transport.

## 2018-05-23 MED ORDER — APIXABAN 5 MG PO TABS
2.5 mg | ORAL_TABLET | Freq: Two times a day (BID) | ORAL | 0 refills | Status: AC
Start: 2018-05-23 — End: 2018-06-19

## 2018-05-23 NOTE — Nursing Note
BP better after given Nifedipine. Stable for home. D/C  Instructions given. Instructed to cont to strain urine at home. Meds for home delivered to bedside. No further questions. To home w/ wife.

## 2018-05-24 LAB — Blood Culture Detection
BLOOD CULTURE FINAL STATUS: NEGATIVE
BLOOD CULTURE FINAL STATUS: NEGATIVE

## 2018-05-25 NOTE — Discharge Summary
Marland Kitchen  HOSPITAL DISCHARGE SUMMARY    DATE OF SERVICE: 05/22/2018  ADMISSION DATE: 05/19/2018  DISCHARGE DATE: 05/22/2018    PMD:  Veronda Prude, MD  [address] / [place] [address]  T: [phone]  F: [phone]    DISCHARGING FACILITY:  [facility]  [address]  [place] [address]  Phone: [phone]    HOSPITAL TEAM: Medicine - Hospitalist     CC: Abdominal Pain (c/o RLQ pain radiating to his lower back that began yesterday with constipation and abdominal distention.)    DISCHARGE DIAGNOSES:  Hypertensive urgency  Hypertension  Acute kidney injury  Nephrolithiasis  Pyelonephritis  Atrial fibrillation      DISCHARGE CONDITION: Good    CODE STATUS: Full    BRIEF HISTORY OF PRESENT ILLNESS:  Calvin Chen is a 62 y.o. male with PMH of HTN, HLD, and paroxysmal Afib admitted to the ICU with nephrolithiasis and uncontrolled hypertension. Pt presented to the ED with severe lower abdominal pain radiating to the back for the past 1 day. Pt was initially waxing and waning and sharp in nature, but has since become constant. Pain associated with nausea and vomiting earlier in the day, but that has since resolved. Pt denies any hematuria but does endorse dysuria yesterday which has since resolved.  Denies any associated CP or SOB. ED work up revealed leukocytosis, hematuria (on UA), and CT ab/pel showing horseshoe kidney with 3mm ureteral stone. Pt was incidentally noted to be hypertensive with persistently elevated SBP >200 while in the ED despite IV and PO medications and was admitted to the ICU for further care. Of noted, pt given ABx and urology consulted.   ???    HOSPITAL COURSE:  In the ICU patient received one dose of labetalol and a dose of hydralazine. His blood pressure and pain were better controlled, and he was transferred out of the ICU to the medicine team. He was treated with IV ceftriaxone 9/30 - 10/3. He remained afebrile and leukocytosis resolved. Attempts were made to train urine to collect kidney stone. His pain improved with fluids and antibiotics. He was to finish three more days of oral ciprofloxacin outpatient to complete the course. Because his blood pressure was hard to control with lisinopril and metoprolol alone, nifedipine three times daily was added to the regimen prior to discharge. For his afib he had not been anticoagulated due to insurance. He was discharged with apixaban.      CONSULTATIONS:  ICU  Urology             SIGNIFICANT IMAGING:  05/19/2018 CT A/P  IMPRESSION:  1. ???Horseshoe kidney with 3 mm stone in the distal right ureter with associated mild hydronephrosis and heterogeneous parenchymal enhancement, suspicious for pyelonephritis.  2. ???Fluid-filled transverse and ascending colon with multiple foci of luminal narrowing, most compatible with bowel peristalsis. There is also equalization of the distal small bowel and the left abdomen, nonspecific but possibly representing delay in bowel transit time.  3. ???Multiple lesions throughout the liver with suggestion of discontinuous peripheral enhancement, compatible with benign hemangiomas  ???    PENDING STUDIES/EVALUATIONS:  BMP in one week at PMD's office    OUTPATIENT WORK-UP NEEDED:  BMP    Future Appointments  Date Time Provider Department Center   05/26/2018 9:30 AM Veronda Prude, MD [facility] CPN   06/06/2018 10:00 AM Cathlean Marseilles., MD U[facility]END[facility] [phone] UROLOGY        APPOINTMENTS TO BE SCHEDULED:  Our [facility] staff will assist with the following appointments:  Primary Care, Veronda Prude, MD, in 1 week      DISPOSITION:  Home or Self Care      RISK OF READMISSION:  LACE+ Risk Stratification    Total Score Risk Stratification   0-28 Minimal Risk   29-58 Moderate Risk   59-78 High Risk   79-90 Highest Risk     Readmission Score            52       Total Score        3 Male Patient    15 Urgent Admission    4 Length of Stay    30 Charlson Score (by age & number of urgent admissions) Criteria that do not apply:    Discharge Institution    Alternative Level of Care Status    ED Visits in Previous 6 Months    Elective Admission in Previous Year            DISCHARGE PHYSICAL EXAM:  BP 97/79  ~ Pulse 80  ~ Temp 36.1 ???C (97 ???F) (Oral)  ~ Resp 18  ~ Ht 5' 7.01'' (1.702 m) Comment: Per chart  ~ Wt 209 lb 14.1 oz (95.2 kg)  ~ SpO2 94%  ~ BMI 32.86 kg/m???     .General: Pleasant, in no acute distress.   Head:  Normocephalic, atraumatic  Eyes: EOMI, conjunctiva normal  ENT:  moist mucous membranes, ears/nose appear normal. Hearing grossly normal.  Neck: supple,  Respiratory: CTA bilaterally, no wheezes or rhonchi. No use of accessory muscles.  Cardiovascular: RRR, normal S1 and S2,  No edema.  GI: soft, nontender, nondistended, normoactive bowel sounds, no CVAT  Skin: no rashes, discharge or erythema, normal skin turgor  MS: No clubbing, cyanosis, joint swelling.   Psych: Oriented to time, place, and person.   Neuro: No focal deficit        DISCHARGE MEDICATIONS:  CURES patient activity report reviewed date: 05/25/2018    .     Medication List      START taking these medications    apixaban 5 mg tablet  Commonly known as:  ELIQUIS  Take 0.5 tablets (2.5 mg total) by mouth two (2) times daily.     ciprofloxacin 500 mg tablet  Commonly known as:  Cipro  Take 1 tablet (500 mg total) by mouth every twelve (12) hours for 3 days.     Naloxone HCl 4 MG/0.1ML Liqd  Call 911. Administer a single spray intranasally into one nostril for opioid overdose. May repeat in 3 minutes if patient is not breathing.Marland Kitchen     NIFEdipine 20 mg capsule  Commonly known as:  Procardia  Take 1 capsule (20 mg total) by mouth every eight (8) hours Note: Do not administer with grapefruit juice...     oxyCODONE 5 mg tablet  Take 1 tablet (5 mg total) by mouth every four (4) hours as needed for Moderate Pain (Pain Scale 4-6). Max Daily Amount: 30 mg     polyethylene glycol powder  Commonly known as:  Glycolax Take 17 g by mouth daily as needed (constipation). Dissolve in 6-8 oz water, juice, tea, beverage of choice.  Notes to patient:  DO NOT TAKE IF HAVING LOOSE STOOLS     SENNA LAXATIVE 8.6 mg tablet  Generic drug:  senna  Take 1 tablet by mouth two (2) times daily as needed for Constipation.     tamsulosin 0.4 mg capsule  Commonly known as:  Flomax  Take 1 capsule (0.4 mg total) by mouth at bedtime.        CHANGE how you take these medications    lisinopril 20 mg tablet  Commonly known as:  Prinivil,Zestril  Take 1 tablet (20 mg total) by mouth daily.  What changed:  ??? medication strength  ??? how much to take        CONTINUE taking these medications    atorvastatin 20 mg tablet  Commonly known as:  Lipitor  Take 1 tablet (20 mg total) by mouth daily.     metoprolol tartrate 25 mg tablet  Commonly known as:  Lopressor  Take 1 tablet (25 mg total) by mouth two (2) times daily.        STOP taking these medications    dabigatran 150 mg capsule  Commonly known as:  Pradaxa     rivaroxaban 20 mg tablet  Commonly known as:  xarelto           Where to Get Your Medications      These medications were sent to [facility] [facility] [place] - [address]  [address] [place] [address]    Phone:  [phone]   ??? apixaban 5 mg tablet  ??? NIFEdipine 20 mg capsule     These medications were sent to [facility] ([place]) [phone] [address], [place] [address]    Hours:  Mon-Fri 8AM-6PM, Holidays 8AM-5PM (Closed 12PM-1PM for lunch); Closed Sat & Sun Phone:  [phone]   ??? ciprofloxacin 500 mg tablet  ??? lisinopril 20 mg tablet  ??? Naloxone HCl 4 MG/0.1ML Liqd  ??? oxyCODONE 5 mg tablet  ??? polyethylene glycol powder  ??? SENNA LAXATIVE 8.6 mg tablet  ??? tamsulosin 0.4 mg capsule             Toniann Fail C. Renaldo Reel, MD, PhD

## 2018-05-26 ENCOUNTER — Ambulatory Visit: Payer: BLUE CROSS/BLUE SHIELD

## 2018-05-26 DIAGNOSIS — N200 Calculus of kidney: Secondary | ICD-10-CM

## 2018-05-26 DIAGNOSIS — Z09 Encounter for follow-up examination after completed treatment for conditions other than malignant neoplasm: Secondary | ICD-10-CM

## 2018-05-26 LAB — Basic Metabolic Panel
CALCIUM: 9.2 mg/dL (ref 8.6–10.4)
CREATININE: 1.23 mg/dL (ref 0.60–1.30)

## 2018-05-26 MED ORDER — LISINOPRIL-HYDROCHLOROTHIAZIDE 20-25 MG PO TABS
2 | ORAL_TABLET | Freq: Every day | ORAL | 1 refills | Status: AC
Start: 2018-05-26 — End: ?

## 2018-05-26 NOTE — Progress Notes
Hospital discharge facility: [facility]   Admission date: 05/18/18  Discharge date: 05/22/18  Discharge diagnosis: Hypertensive urgency  Hypertension  Acute kidney injury  Nephrolithiasis  Pyelonephritis  Atrial fibrillation  Date of interactive contact with patient: 05/22/18  Date of face-to-face physician visit: 05/27/2018      Insert Progress Note Here:     Calvin Chen CPN     Primary Care Progress Note        Date: 05/27/2018    Chief Complaint   Patient presents with   ??? Post Hospital Discharge Follow Up       BP 166/90  ~ Pulse 58  ~ Temp 36.6 ???C (97.8 ???F) (Oral)  ~ Wt 210 lb 3.2 oz (95.3 kg)  ~ BMI 32.91 kg/m???     SUBJECTIVE:     Calvin Chen is a 62 y.o. male who presents for hospital follow up    See notes from discharge summary below    ''BRIEF HISTORY OF PRESENT ILLNESS:  Calvin Chen???is a 62 y.o.???male???with PMH of HTN, HLD, and paroxysmal Afib admitted to the ICU with nephrolithiasis and uncontrolled hypertension. Pt presented to the ED with severe lower abdominal pain radiating to the back for the past 1 day. Pt was initially waxing and waning and sharp in nature, but has since become constant. Pain associated with nausea and vomiting earlier in the day, but that has since resolved. Pt denies any hematuria but does endorse dysuria yesterday which has since resolved. ???Denies any associated CP or SOB. ED work up revealed leukocytosis, hematuria (on UA), and CT ab/pel showing horseshoe kidney with 3mm ureteral stone. Pt was incidentally noted to be hypertensive with persistently elevated SBP >200 while in the ED despite IV and PO medications and was admitted to the ICU for further care. Of noted, pt given ABx and urology consulted.   ???  ???  HOSPITAL COURSE:  In the ICU patient received one dose of labetalol and a dose of hydralazine. His blood pressure and pain were better controlled, and he was transferred out of the ICU to the medicine team. He was treated with IV ceftriaxone 9/30 - 10/3. He remained afebrile and leukocytosis resolved. Attempts were made to train urine to collect kidney stone. His pain improved with fluids and antibiotics. He was to finish three more days of oral ciprofloxacin outpatient to complete the course. Because his blood pressure was hard to control with lisinopril and metoprolol alone, nifedipine three times daily was added to the regimen prior to discharge. For his afib he had not been anticoagulated due to insurance. He was discharged with apixaban.''  ???    1. Kidney stone/Pyelonephritis : passed over the weekend.  Pain has some mild tenderness but almost gone.  Will bring in sample.  Has completed cipro     2. Hypertension : no chest pain or shortness of breath.  No nausea or vomiting.  Taking bp medications daily.  Is not taking the nifedipine prescribed in the hospital.      3. afib : on eliquis 2.5 bid.  Was on pradaxa while he was at [facility].  Changed to eliquis to try to save money.  Still has a $200 copay.  Received first month free with coupon.  Taking lower dose due to elevated creatinine in the hospital.        Patient Active Problem List   Diagnosis   ??? Atrial fibrillation (HCC/RAF)   ??? Benign prostatic hyperplasia   ??? Prediabetes   ??? Erectile  dysfunction   ??? HTN (hypertension)   ??? Hyperlipidemia   ??? Long term current use of anticoagulant   ??? Obstructive sleep apnea   ??? Obesity   ??? Urinary tract infectious disease   ??? Hypertensive urgency       Medication:  Medications that the patient states to be currently taking   Medication Sig   ??? apixaban (ELIQUIS) 5 mg tablet Take 0.5 tablets (2.5 mg total) by mouth two (2) times daily.   ??? atorvastatin 20 mg tablet Take 1 tablet (20 mg total) by mouth daily.   ??? lisinopril 20 mg tablet Take 1 tablet (20 mg total) by mouth daily.   ??? metoprolol tartrate 25 mg tablet Take 1 tablet (25 mg total) by mouth two (2) times daily.       Allergies:   Allergies   Allergen Reactions ??? Sulfa Antibiotics Rash     Bactrim  Diffuse maculopapular rash, fever   ??? Naproxen Rash       ROS: see HPI    Social History   Substance Use Topics   ??? Smoking status: Never Smoker   ??? Smokeless tobacco: Never Used   ??? Alcohol use 2.4 oz/week     4 Glasses of Wine (5 oz) per week       family history includes Diabetes in his mother; Heart disease in his mother; mitral valve disease in his brother.      PHYSICAL EXAM:     BP 166/90  ~ Pulse 58  ~ Temp 36.6 ???C (97.8 ???F) (Oral)  ~ Wt 210 lb 3.2 oz (95.3 kg)  ~ BMI 32.91 kg/m???   Gen: No acute distress, alert & oriented, pleasant and cooperative  Heent: tms clear bilaterally, moist mucous membranes  Neck: no sig lymphadenopathy   Lungs: Clear to auscultation bilaterally, no wheezing, rales, or rhonchi  CV: Regular rate and rhythm, no murmurs, clicks, or rubs  Abd: nondistended, normal active bowel sounds, soft, mild rlq tenderness  Skin: No obvious skin lesions  Neuro: No focal neurologic defects    STUDIES:     Relevant labs and imaging were reviewed today by me in conjunction with the patient.    Admission on 05/18/2018, Discharged on 05/22/2018   No results displayed because visit has over 200 results.          Labs from hospitalization reviewed in detail       ASSESSMENT & PLAN:     Diagnoses and all orders for this visit:    Hospital discharge follow-up    Hypertensive urgency  -     Basic Metabolic Panel; Future    Nephrolithiasis  -     Cancel: Stone Analysis; Future  -     [employer]; Future    Other orders  -     lisinopril-hydroCHLOROthiazide 20-25 mg tablet; Take 2 tablets by mouth daily.    will change from lisinopril 20 to lisinopril/hct 20/25 2 tablets daily    Patient will try to use coupon for eliquis - if unsuccessful will refer to cardiology or try to obtain prior auth from insurance.    Return in about 1 month (around 06/26/2018).    Call or return to clinic prn if these symptoms worsen or fail to improve as anticipated. The above plan was explained and reviewed extensively with the patient.  All questions were answered.    Veronda Prude, MD  Family Medicine  CPN [facility]  Hospital records reviewed today including: admitting history and physical, discharge summary, diagnostic studies, imaging studies and home medication reconciliation.     CPT Code 99495 met requiring:  ? Communication (direct contact, telephone, electronic) with the patient and/or caregiver within 2 business days of discharge.  ? Medical decision making of at least moderate complexity during the service period  ? Face to face visit with the patient within 14 calendar days of discharge (which should include obtaining and reviewing the discharge summary, medication reconciliation, etc.)      CPT Code 99496 met requiring:  ? Communication (direct contact, telephone, electronic) with the patient and/or caregiver within 2 business days of discharge  ? Medical decision making of high complexity during the service period  ? Face to face visit within 7 calendar days of discharge (which should include obtaining and reviewing the discharge summary, medication reconciliation, etc.)

## 2018-05-31 ENCOUNTER — Ambulatory Visit: Payer: BLUE CROSS/BLUE SHIELD

## 2018-06-03 DIAGNOSIS — R361 Hematospermia: Secondary | ICD-10-CM

## 2018-06-03 DIAGNOSIS — N200 Calculus of kidney: Secondary | ICD-10-CM

## 2018-06-03 NOTE — Telephone Encounter
Referral done, please contact patient when ready.

## 2018-06-03 NOTE — Progress Notes
PATIENT:  Calvin Chen   MRN:  [id number]  DOB:  11/02/1955  DATE OF SERVICE:  06/03/2018  PRIMARY CARE PROVIDER: Veronda Prude, MD    CHIEF COMPLAINT:   Nephrolithiasis      Dear Dr. Cathlean Marseilles., MD   I had the privilege of seeing your patient for initial consultation in the [facility] Urology practice today for kidney stones.    HISTORY OF PRESENT ILLNESS     Calvin Chen is a 62 y.o. male with history of sleep apnea, HTN, and A-fib, who is referred for nephrolithiasis. No previous history of stones. On 05/19/18, patient presented to [facility] ED for sharp, RLQ abdominal pain with radiation to the back +N/V/dysuria. Urine microscopy identified microhematuria, and CT A/P 05/19/18 showed a right horseshoe kidney with a 3 mm distal ureteral stone. He was admitted to the ICU until 05/22/18 for further care and IV ceftriaxone. Nifedipine TID was added to his HTN regimen, and apixaban was added for anticoagulation prior to discharge.     He eventually passed the stone and brought it in to his PCP. Stone analysis 05/26/18 revealed 90% uric acid and 10% CaOx composition. Today, patient reports some difficulty urinating with force of stream in addition to some blood in his ejaculate. Previously followed by a urologist at [facility] for nocturia. He has been on flomax in the past which did not improve his symptoms. Denies any flank pain or dysuria.       PAST MEDICAL HISTORY     Past Medical History:   Diagnosis Date   ??? Hyperlipidemia 2009   ??? Hypertension 2009   ??? Sleep apnea 2014    Use APAP     PAST SURGICAL HISTORY     Past Surgical History:   Procedure Laterality Date   ??? none       MEDICATIONS     Current Outpatient Prescriptions   Medication Sig   ??? apixaban (ELIQUIS) 5 mg tablet Take 1 tablet (5 mg total) by mouth two (2) times daily.   ??? apixaban (ELIQUIS) 5 mg tablet Take 0.5 tablets (2.5 mg total) by mouth two (2) times daily.   ??? atorvastatin 20 mg tablet Take 1 tablet (20 mg total) by mouth daily. ??? lisinopril 20 mg tablet Take 1 tablet (20 mg total) by mouth daily.   ??? lisinopril-hydroCHLOROthiazide 20-25 mg tablet Take 2 tablets by mouth daily.   ??? metoprolol tartrate 25 mg tablet Take 1 tablet (25 mg total) by mouth two (2) times daily.   ??? NIFEdipine 20 mg capsule Take 1 capsule (20 mg total) by mouth every eight (8) hours Note: Do not administer with grapefruit juice...   ??? oxyCODONE 5 mg tablet Take 1 tablet (5 mg total) by mouth every four (4) hours as needed for Moderate Pain (Pain Scale 4-6). Max Daily Amount: 30 mg   ??? polyethylene glycol powder Take 17 g by mouth daily as needed (constipation). Dissolve in 6-8 oz water, juice, tea, beverage of choice.   ??? senna 8.6 mg tablet Take 1 tablet by mouth two (2) times daily as needed for Constipation.   ??? tamsulosin 0.4 mg capsule Take 1 capsule (0.4 mg total) by mouth at bedtime.     No current facility-administered medications for this visit.         ALLERGIES     Allergies   Allergen Reactions   ??? Sulfa Antibiotics Rash     Bactrim  Diffuse maculopapular rash, fever   ???  Naproxen Rash        SOCIAL HISTORY     Social History     Social History   ??? Marital status: Married     Spouse name: N/A   ??? Number of children: N/A   ??? Years of education: N/A     Occupational History   ??? [employer] Associate     Social History Main Topics   ??? Smoking status: Never Smoker   ??? Smokeless tobacco: Never Used   ??? Alcohol use 2.4 oz/week     4 Glasses of Wine (5 oz) per week   ??? Drug use: No   ??? Sexual activity: Yes     Partners: Female     Other Topics Concern   ??? Do You Exercise At Least A Day, 3 Or More Days A Week? Yes   ??? Types Of Exercise? (List In Comments) Yes     Walking, cycling, gym   ??? Do You Follow A Special Diet? No   ??? Vegan? No   ??? Vegetarian? No   ??? Pescatarian? No   ??? Lactose Free? No   ??? Gluten Free? No   ??? Omnivore? Yes     Social History Narrative    Diet: needs work Exercise: has been increasing his level - walks daily; cycles 1-2 times a week, gym 1-2 times per week     Sleep: 6 hours last night      Occupation: Clinical research associate    FAMILY HISTORY     Family History   Problem Relation Age of Onset   ??? Diabetes Mother    ??? Heart disease Mother    ??? Other (mitral valve disease) Brother         REVIEW OF SYSTEMS     Review of Systems: A detailed 14 point review of systems was ascertained today including: endocrine, integument, cardiovascular, pulmonary, GI, urinary, hematologic, neurologic/psychiatric. No pertinent positives other than those listed per HPI.     VITALS:   There were no vitals filed for this visit.      PHYSICAL EXAM     General:  The patient is a well-developed and well nourished male in no acute distress with normal attention to grooming. The patient is awake, alert and cooperative.    HEENT: normocephalic, PERRLA, normal ear and nose structures, with no scars or lesions.    Skin: warm, dry with normal turgor, with no lesions seen.    Neck: supple, no bruits, no lymph nodes palpable.    Lungs: normal respiratory effort.    Heart: regular rate and rhythym.     Abdomen:  The patient has no palpable masses. The abdomen has no tenderness. No hepatosplenomegaly. no hernia.    GU: 30-40 gram prostate. Firm, no nodules.     Back: no CVA tenderness.     Suprapubic area: no tenderness over the bladder. Nondistended.    Extremities: Upper with no deformities. Lower - no edema.    Neurologic: The patient is oriented to person, place and time, and has normal mood and affect. The patient is dressed appropriately for the weather.      LABORATORY DATA     Lab Results   Component Value Date    WBC 11.11 (H) 05/22/2018    HGB 13.0 (L) 05/22/2018    HCT 40.1 05/22/2018    MCV 84.1 05/22/2018    PLT 273 05/22/2018       Lab Results  Component Value Date    NA 137 05/26/2018     Lab Results   Component Value Date    K 4.8 05/26/2018     Lab Results   Component Value Date CL 99 05/26/2018     Lab Results   Component Value Date    CO2 23 05/26/2018     Lab Results   Component Value Date    BUN 18 05/26/2018     Lab Results   Component Value Date    CREAT 1.23 05/26/2018     Lab Results   Component Value Date    GLUCOSE 92 05/26/2018     PSA:  03/18/2017 1.3  05/23/2015 1.2  01/12/2014 1.2    Urinalysis:  Component 05/19/2018   Urine Color       Straw   Specific Gravity      1.005 - 1.030 1.012   pH,Urine      5.0 - 8.0 8.0   Blood,Dipstick      Negative 2+ (A)   Bili,Dipstick      Negative Negative   Ketones      Negative Negative   Glucose,Random Urine      Negative 1+ (A)   Protein      Negative 2+ (A)   Leukocyte Esterase      Negative Negative   Nitrite      Negative Negative     Urine Microscopy:   Component 05/19/2018   RBC per uL      0 - 11 cells/uL 331 (H)   WBC per uL      0 - 22 cells/uL 8   RBC per HPF      0 - 2 cells/HPF 70 (H)     Urine Cultures:   Component Bacterial Culture Urine   05/19/2018 No Growth at 1:1000 dilution     05/26/18 Stone Analysis:  Calculi composed primarily of:   10% calcium oxalate dihydrate, and   90% uric acid.     Procedures:  06/06/18 post void residual 17cc    IMAGING     05/19/18 CT A/P w contrast   Horseshoe kidney with 3 mm stone in the distal right ureter with associated mild hydronephrosis and heterogeneous parenchymal enhancement, suspicious for pyelonephritis.  Bladder: Unremarkable.  Reproductive organs: Mild prostatomegaly.    05/20/18 US Renal   Mild dilation of the right collecting system. No shadowing stones seen.    IMPRESSION     62 y.o. male with history of sleep apnea, HTN, and A-fib, who is referred for nephrolithiasis. He was admitted to [facility] from 05/19/18 to 05/22/18 after presented to ED with right abdominal pain; later identified microhematuria, and leukocytosis. CT A/P 05/19/18 showed a right horseshoe kidney with a 3 mm ureteral stone.     He passed the stone, and analysis revealed 90% uric acid and 10% CaOx composition. We discussed that his uric acid stone history is preventable with KCit supplements or Litholyte. On my read of the imaging, there are some calcifications in his prostate. Post void residual today is low. DRE today is normal (30-40g) and last PSA 03/18/17 was stable at 1.3. We can wait until next year to draw another PSA and consider cystoscopy at that time as well.     RECOMMENDATION     -start KCit 10 mEq BID to prevent uric acid stones    -Return to clinic in 1 year for PSA check      At least 45 minutes  of coordinated time was spent with the patient, of which greater than 50% was spent in counseling, coordination of care and a detailed question and answer session.       SCRIBE ATTESTATION:  Maurie Boettcher, have assisted Dr. Madlyn Frankel. Dunn, with the documentation for Calvin Chen on 06/06/2018 9:45 AM.    This note was dictated by me to scribe directly and therefore I formulated entire note including history, physical exam, review of systems and assessment/plan.      PROVIDER SIGNATURE:    Madlyn Frankel. Shea Evans, MD  Department of Urology  [facility] [place], [place]    CC:  Veronda Prude, MD  [id number]  [place] [address]    Tel: [phone]  Fax: [phone]

## 2018-06-04 ENCOUNTER — Ambulatory Visit: Payer: BLUE CROSS/BLUE SHIELD

## 2018-06-05 ENCOUNTER — Ambulatory Visit: Payer: BLUE CROSS/BLUE SHIELD

## 2018-06-06 ENCOUNTER — Ambulatory Visit: Payer: Commercial Managed Care - Pharmacy Benefit Manager

## 2018-06-06 ENCOUNTER — Ambulatory Visit: Payer: PRIVATE HEALTH INSURANCE

## 2018-06-06 MED ORDER — POTASSIUM CITRATE ER 10 MEQ (1080 MG) PO TBCR
10 meq | ORAL_TABLET | Freq: Two times a day (BID) | ORAL | 3 refills | Status: AC
Start: 2018-06-06 — End: ?

## 2018-06-18 ENCOUNTER — Ambulatory Visit: Payer: BLUE CROSS/BLUE SHIELD

## 2018-06-18 DIAGNOSIS — I10 Essential (primary) hypertension: Secondary | ICD-10-CM

## 2018-06-18 DIAGNOSIS — E785 Hyperlipidemia, unspecified: Secondary | ICD-10-CM

## 2018-06-18 DIAGNOSIS — I480 Paroxysmal atrial fibrillation: Secondary | ICD-10-CM

## 2018-06-18 MED ORDER — RIVAROXABAN 20 MG PO TABS
20 mg | ORAL_TABLET | Freq: Every day | ORAL | 3 refills | Status: AC
Start: 2018-06-18 — End: ?

## 2018-06-18 NOTE — Progress Notes
Outpatient Cardiology Visit    PATIENT: Calvin Chen  MRN: [id number]  DOB: 1956/06/10  DATE OF SERVICE: 06/18/2018      PRIMARY CARE PROVIDER: Veronda Prude, MD    REFERRING PHYSICIAN:   Veronda Prude, MD  [address]  [id number]  [place], [place] [address]    DATE OF SERVICE: 06/18/2018    REASON FOR VISIT: Paroxysmal atrial fibrillation    PROBLEM LIST:    Calvin Chen is a 62 y.o. male with a history of:    Paroxysmal Atrial Fibrillation:   Previously followed at [facility] and per patient on pradaxa, however not covered by insurance but xarelto is.  On metoprolol.  Hx of Afib with RVR 07/2017 Calvin Chen)    HTN:  On metoprolol 25 po bid and lisinopril 10.      Dyslipidemia:  On atorvastatin 20 mg    DM:  A1c 6.7 on June 2019.  Being followed by PCP.  Not on any DM meds.    HISTORY OF PRESENT ILLNESS/INTEVAL EVENTS:  04/08/18:   PATIENT PRESENTS TODAY TO ESTABLISH CARE IN CARDIOLOGY CLINIC FOR THE ABOVE mentioned problems.  Patient presents today to establish care in Cardiology Clinic for the above-mentioned problems he denies any symptoms.  Denies any chest pain, shortness of breath, dyspnea on exertion, palpitations, presyncope, or syncope.  He states that he was previously on Pradaxa however he was told that that is not covered by insurance any further and but the Xarelto is being covered.  Patient takes lisinopril and metoprolol as described above for blood pressure however he does not check blood pressure at home and is not sure what his baseline blood pressure.  Not taking any diabetes medications.      06/18/18:  Denies any symptoms, however patient endorses that his insurance will not cover apixaban.  Has new BP machine at home but has not checked BP     PAST MEDICAL HISTORY:  Past Medical History:   Diagnosis Date   ??? Hyperlipidemia 2009   ??? Hypertension 2009   ??? Sleep apnea 2014    Use APAP       PAST SURGICAL HISTORY:  Past Surgical History:   Procedure Laterality Date   ??? none         OUTPATIENT MEDICATIONS: Outpatient Medications Prior to Visit   Medication Sig Dispense Refill   ??? atorvastatin 20 mg tablet Take 1 tablet (20 mg total) by mouth daily. 90 tablet 1   ??? lisinopril 20 mg tablet Take 1 tablet (20 mg total) by mouth daily. 30 tablet 0   ??? lisinopril-hydroCHLOROthiazide 20-25 mg tablet Take 2 tablets by mouth daily. (Patient not taking: Reported on 06/18/2018.) 180 tablet 1   ??? metoprolol tartrate 25 mg tablet Take 1 tablet (25 mg total) by mouth two (2) times daily. 180 tablet 1   ??? NIFEdipine 20 mg capsule Take 1 capsule (20 mg total) by mouth every eight (8) hours Note: Do not administer with grapefruit juice... 90 capsule 0   ??? oxyCODONE 5 mg tablet Take 1 tablet (5 mg total) by mouth every four (4) hours as needed for Moderate Pain (Pain Scale 4-6). Max Daily Amount: 30 mg 30 tablet 0   ??? polyethylene glycol powder Take 17 g by mouth daily as needed (constipation). Dissolve in 6-8 oz water, juice, tea, beverage of choice. 510 g 0   ??? potassium citrate 10 mEq (1080 mg) ER tablet Take 1 tablet (10 mEq total) by mouth two (2) times  daily with meals. 90 tablet 3   ??? senna 8.6 mg tablet Take 1 tablet by mouth two (2) times daily as needed for Constipation. 30 tablet 0   ??? tamsulosin 0.4 mg capsule Take 1 capsule (0.4 mg total) by mouth at bedtime. 30 capsule 0   ??? apixaban (ELIQUIS) 5 mg tablet Take 1 tablet (5 mg total) by mouth two (2) times daily. (Patient not taking: Reported on 06/18/2018.) 180 tablet 3   ??? apixaban (ELIQUIS) 5 mg tablet Take 0.5 tablets (2.5 mg total) by mouth two (2) times daily. 30 tablet 0     No facility-administered medications prior to visit.        ALLERGIES:  Allergies   Allergen Reactions   ??? Sulfa Antibiotics Rash     Bactrim  Diffuse maculopapular rash, fever   ??? Naproxen Rash       SOCIAL HISTORY:  Social History     Social History   ??? Marital status: Married     Spouse name: N/A   ??? Number of children: N/A   ??? 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 Smoking status: Never Smoker   ??? Smokeless tobacco: Never Used   ??? Alcohol use 2.4 oz/week     4 Glasses of Wine (5 oz) per week   ??? Drug use: No   ??? Sexual activity: Yes     Partners: Female     Other Topics Concern   ??? Do You Exercise At Least A Day, 3 Or More Days A Week? Yes   ??? Types Of Exercise? (List In Comments) Yes     Walking, cycling, gym   ??? Do You Follow A Special Diet? No   ??? Vegan? No   ??? Vegetarian? No   ??? Pescatarian? No   ??? Lactose Free? No   ??? Gluten Free? No   ??? Omnivore? Yes     Social History Narrative    Diet: needs work    Exercise: has been increasing his level - walks daily; cycles 1-2 times a week, gym 1-2 times per week     Sleep: 6 hours last night        FAMILY HISTORY:  Family History   Problem Relation Age of Onset   ??? Diabetes Mother    ??? Heart disease Mother    ??? Other (mitral valve disease) Brother        REVIEW OF SYSTEMS:  A 14 point review of systems was conducted and as per HPI and Interval Events otherwise negative.     PHYSICAL EXAMINATION:  VITALS: BP 158/80  ~ Pulse 60  ~ Resp 18  ~ Ht 5' 7'' (1.702 m)  ~ Wt 203 lb (92.1 kg)  ~ SpO2 97%  ~ BMI 31.79 kg/m???    General: well developed well nourished male in no acute distress  HEENT: extraocular movements intact, anicteric, oropharynx clear, moist mucous membranes  Neck: no jugular venous distention. No lymphadenopathy  CV: regular rate and rhythm, normal S1, S2. no rubs, murmurs, or gallops  Pulm: clear to auscultation bilaterally, no retractions  GI: soft, non tender, non distended, normoactive bowel sounds, no hepatosplenomegaly  Ext: no clubbing, cyanosis, or edema  Vascular: 2+ pulses throughout  Skin: warm, dry , and well perfused  Neuro: grossly non focal  Psych: mood and affect appropriate  MS: strength  intact        LABORATORY:  Lab Results   Component Value Date    HGBA1C 6.7 (H) 01/20/2018 Last 3 Lipids (Up to last 3 results from past 56213 hours)      06/03 1556    Cholesterol       198  Comment:  The significance of total cholesterol depends on the values of LDL, HDL, triglycerides and the clinical context. A patient-provider discussion may be considered.       Cholesterol, HDL       48  Comment:  If HDL cholesterol level falls outside of the designatedrange, a patient-provider discussion is recommended       Triglycerides       178  Comment:  If Triglyceride level falls outside of the designated range,a patient-provider discussion is recommended.(!)       Non-HDL,Chol,Calc       150  Comment:  If Non-HDL cholesterol level falls outside of the designatedrange, a patient-provider discussion is recommended.(!)           2018 ACC/AHA guidelines recommends moderate or high-intensity statin because 10--year ASCVD risk>=7.5%. Patient is receiving guideline-directed statin therapy; monitor adherence.  10-year ASCVD risk  is 13.5%. Continue moderate or high-intensity statin therapy. Monitor adherence. as of 12:01 PM on 06/18/2018  10-year ASCVD risk with optimal risk factors is 4.6%.  Values used to calculate ASCVD score:  Age: 62 y.o.   Gender: Male Race: Not African American.  HDL cholesterol: 48 mg/dL. HDL cholesterol measured 149 days ago.  Total cholesterol: 198 mg/dL. Total cholesterol measured 149 days ago.  LDL cholesterol: 114 mg/dL. LDL cholesterol measured 149 days ago.  Systolic BP: 158 mm Hg. BP was measured today.  The patient is being treated with a medication that influences SBP.  The patient is currently not a smoker.  The patient does not have a diagnosis of diabetes.    Click here for the [facility] ASCVD Cardiovascular Risk Estimator Plus tool Office manager).        DIAGNOSTIC STUDIES:  ECG performed on 04/08/18 reviewed by me shows NSR 71 bpm, PR 138 msec, QRS 98 msec, QTc 434 msec.  TWI lateral leads.     TTE [facility] 07/2017  1. Technically difficult study. 2. Normal LV size, mild LVH, normal LVEF 55-60%.  3. Mild left atrial enlargement.  4. No significant valvular abnormalities.  5. IVC diameter is 2.2 cm with a < 50% inspiratory collapse, suggestive of a  right atrial pressure of 10-20 mmHg.    [facility] 07/2017  Cath Findings:   Coronary angiography   Left main: Minimal luminal irregularity   LAD : Minimal luminal irregularity   Diagonal : Minimal luminal irregularity   Circumflex : Minimal luminal irregularity   Obtuse marginal : Minimal luminal irregularity   RCA : Dominant vessel. Minimal luminal irregularity   PDA : Minimal luminal irregularity   PLV : Minimal luminal irregularity     Hemodynamics:   LVEDP 17 mmHg  LV 171/0 mmHg  AO 185/93/132 mmHg  ???  Left Ventriculogram??????  LV EF 60-65%  LV regional Motion abnormality: None  Mitral regurgitation: None     ASSESSMENT:  Calvin Chen is a 63 y.o. male with:    Paroxysmal Atrial Fibrillation:   Previously followed at [facility] and per patient on pradaxa, however not covered by insurance but xarelto is.  On metoprolol.  Hx of Afib with [facility] 07/2017 [facility])  -Continue metoprolol 25 po bid  -Rivaroxaban 20 mg  daily prescribed (CHA2DS2-Vasc score of 2 HTN and DM) today 06/18/18.  If not covered by his insurance patient will let us know which NOAC is covered so we can provide such prescription (Xarelto, vs pradaxa, vs Eliquis)    HTN:  On metoprolol 25 po bid and lisinopril 10.    - Above goal.  Advised daily BP logs at home in AM    Dyslipidemia:  On atorvastatin 20 mg  -Continue moderate dose statin.      Prediabetes/DM:  A1c 6.7 on June 2019.  Being followed by PCP.    -As per PCP    In the absence of any new symptoms patient will return to clinic in 12 months.  Return precautions discussed and contact information provided.    Thank you Dr. Lucienne Minks for allowing me to participate in the care of your patient. Please do not hesitate to contact me if any questions or concerns arise in the meantime.     Lennox Solders MD PhD [facility] Cardiology  [address] [place], [address] [place]  Telephone: [phone]  Fax: [phone]  Email: [email or web address]  ???      Author: Lennox Solders, MD 06/18/2018 12:01 PM

## 2018-07-03 ENCOUNTER — Ambulatory Visit: Payer: PRIVATE HEALTH INSURANCE | Attending: Occupational Medicine
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
## 2018-11-10 ENCOUNTER — Telehealth: Payer: PRIVATE HEALTH INSURANCE

## 2018-11-10 NOTE — Telephone Encounter
PDL Call to Practice    Reason for Call:Pt stated he had an episode around 12:30 where he experienced light headedness and an increased heart beat and blood pressure as well.   MD: Dr. Tonette Lederer    Appointment Related?  []  Yes  [x]  No     If yes;  Date:  Time:    Call warm transferred to PDL: [x]  Yes  []  No    Call Received by Practice Representative:Kevin

## 2018-11-10 NOTE — Telephone Encounter
Pt states that he had light headedness and rapid pounding HR and BP about an hour ago. Pt states he is currently feeling better.No SOB, back pain, nor tingling down arm nor chest pain. Pt does state that he did have some pressures earlier but states it might have been due to bloating. Pt would like a call back or advised on what to due since he is now feeling better. I advised pt that MD will be paged and that if he feels as if symptoms become worse or life threatening to call 911 or go to nearest ER.

## 2018-11-10 NOTE — Telephone Encounter
Contacted pt and advised him per Dr. Tonette Lederer that if his HR reaches 90 and systolic reaches 073 to take an extra 25 mg of Metoprolol and if his symptoms do not get better with in an hour of taking Metoprolol to give our clinic a call back.

## 2018-12-04 ENCOUNTER — Ambulatory Visit: Payer: BLUE CROSS/BLUE SHIELD

## 2018-12-04 MED ORDER — METOPROLOL TARTRATE 25 MG PO TABS
25 mg | ORAL_TABLET | Freq: Two times a day (BID) | ORAL | 1 refills | Status: AC
Start: 2018-12-04 — End: ?

## 2019-01-30 MED ORDER — ATORVASTATIN CALCIUM 20 MG PO TABS
ORAL_TABLET | 3 refills
Start: 2019-01-30 — End: ?

## 2019-02-02 ENCOUNTER — Telehealth: Payer: BLUE CROSS/BLUE SHIELD

## 2019-02-02 DIAGNOSIS — M25572 Pain in left ankle and joints of left foot: Secondary | ICD-10-CM

## 2019-02-02 DIAGNOSIS — L989 Disorder of the skin and subcutaneous tissue, unspecified: Secondary | ICD-10-CM

## 2019-02-02 DIAGNOSIS — M25552 Pain in left hip: Secondary | ICD-10-CM

## 2019-02-02 MED ORDER — ATORVASTATIN CALCIUM 20 MG PO TABS
ORAL_TABLET | 3 refills | Status: AC
Start: 2019-02-02 — End: ?

## 2019-02-02 NOTE — Patient Instructions
[place]  [phone]

## 2019-02-02 NOTE — Progress Notes
[place] [facility]     Video Visit    Patient Consent to Telehealth Questionnaire   [facility] TELEHEALTH PRECHECKIN QUESTIONS 02/02/2019   By clicking ''I Agree'', I consent to the below:  I Agree     - I agree  to be treated via a video visit and acknowledge that I may be liable for any relevant copays or coinsurance depending on my insurance plan.  - I understand that this video visit is offered for my convenience and I am able to cancel and reschedule for an in-person appointment if I desire.  - I also acknowledge that sensitive medical information may be discussed during this video visit appointment and that it is my responsibility to locate myself in a location that ensures privacy to my own level of comfort.  - I also acknowledge that I should not be participating in a video visit in a way that could cause danger to myself or to those around me (such as driving or walking).  If my provider is concerned about my safety, I understand that they have the right to terminate the visit.       Patient:  Calvin Chen  Medical record number:  [id number]  Date of birth:  Mar 04, 1956  Date of service:  02/02/2019    Cc: growth on nose     SUBJECTIVE:     Calvin Chen is a 63 y.o. male who wishes to discuss:    1. Growth on nose : present for 2-3 months.  History of skin cancer in the past.      2. Left hip pain x 3 months : worse when sleeping on his left side.  No fall or acute injury.  Has not tried otc meds. Tried stretching without much improvement.      3. Left ankle pain: where tendon connects to his foot.  Has been present for one month.  No fall or acute injury.  Has not tried any otc meds.    Stretching has helped ankle.  No swelling.      Has been walking and riding his bike more.    Foot hurts when he walks.    No instability symptoms.    No history of hip or ankle injuries in the past.      Has lost weight - now weights 201 lbs.      Patient Active Problem List   Diagnosis   ??? Atrial fibrillation (HCC/RAF) ??? Benign prostatic hyperplasia   ??? Prediabetes   ??? Erectile dysfunction   ??? HTN (hypertension)   ??? Hyperlipidemia   ??? Long term current use of anticoagulant   ??? Obstructive sleep apnea   ??? Obesity   ??? Urinary tract infectious disease   ??? Hypertensive urgency       Medication:  Outpatient Medications Prior to Visit   Medication Sig   ??? atorvastatin 20 mg tablet Take 1 tablet (20 mg total) by mouth daily.   ??? lisinopril-hydroCHLOROthiazide 20-25 mg tablet Take 2 tablets by mouth daily.   ??? metoprolol tartrate 25 mg tablet Take 1 tablet (25 mg total) by mouth two (2) times daily.   ??? oxyCODONE 5 mg tablet Take 1 tablet (5 mg total) by mouth every four (4) hours as needed for Moderate Pain (Pain Scale 4-6). Max Daily Amount: 30 mg (Patient not taking: Reported on 02/02/2019.)   ??? polyethylene glycol powder Take 17 g by mouth daily as needed (constipation). Dissolve in 6-8 oz water, juice, tea, beverage  of choice.   ??? rivaroxaban (XARELTO) 20 mg tablet Take 1 tablet (20 mg total) by mouth daily with dinner. (Patient not taking: Reported on 02/02/2019.)   ??? senna 8.6 mg tablet Take 1 tablet by mouth two (2) times daily as needed for Constipation. (Patient not taking: Reported on 02/02/2019.)     No facility-administered medications prior to visit.        Allergies:   Allergies   Allergen Reactions   ??? Sulfa Antibiotics Rash     Bactrim  Diffuse maculopapular rash, fever   ??? Naproxen Rash       Social History     Tobacco Use   ??? Smoking status: Never Smoker   ??? Smokeless tobacco: Never Used   Substance Use Topics   ??? Alcohol use: Yes     Alcohol/week: 2.4 oz     Types: 4 Glasses of Wine (5 oz) per week          Family History   Problem Relation Age of Onset   ??? Diabetes Mother    ??? Heart disease Mother    ??? Other (mitral valve disease) Brother        Review of Systems: see HPI    PHYSICAL EXAM:     Gen: No acute distress, alert & oriented, pleasant and cooperative  Well appearing, speaking in complete sentences Nose: crusted lesion on right side of nose, possible scc    STUDIES:     Relevant labs and imaging were reviewed today by me in conjunction with the patient.    Office Visit on 06/06/2018   Component Date Value Ref Range Status   ??? Glucose 06/06/2018 Negative  Negative Final   ??? Bilirubin 06/06/2018 Negative  Negative Final   ??? Ketone 06/06/2018 Negative  Negative Final   ??? Specific Gravity 06/06/2018 1.020  <1.005, 1.005, 1.010, 1.015, 1.020, 1.025, 1.030 Final   ??? Blood 06/06/2018 Moderate (++)* Negative Final   ??? pH 06/06/2018 6.5  <5.0, 5.0, 5.5, 6.0, 6.5, 7.0, 7.5, 8.0, 8.5 Final   ??? Protein 06/06/2018 Trace* Negative Final   ??? Urobilinogen 06/06/2018 0.2 mg/dL  0.2 mg/dL, 1.0 mg/dL Final   ??? Nitrite 91/47/8295 Negative  Negative Final   ??? Leukocyte 06/06/2018 Trace* Negative Final         ASSESSMENT & PLAN:       Diagnoses and all orders for this visit:    Skin lesion  -     Referral to Dermatology    Left hip pain  Comments:  possible trochanteric bursitis.  ice, rest  avoid walking  continue with bicycling  consider in person appt for injection or ortho eval     Acute left ankle pain  Comments:  elevation, ice, rest  in person appt for xray if needed        Call eimg for video appointment for referrals.    Patient Instructions   [facility]  [phone]          Call or return to clinic prn if these symptoms worsen or fail to improve as anticipated.     The above plan was explained and reviewed extensively with the patient.  All questions were answered.     Veronda Prude, MD  Family Medicine  [facility]

## 2019-02-04 ENCOUNTER — Ambulatory Visit: Payer: PRIVATE HEALTH INSURANCE

## 2019-02-04 ENCOUNTER — Telehealth: Payer: BLUE CROSS/BLUE SHIELD

## 2019-02-04 DIAGNOSIS — Z86006 Personal history of melanoma in-situ: Secondary | ICD-10-CM

## 2019-02-04 DIAGNOSIS — L989 Disorder of the skin and subcutaneous tissue, unspecified: Secondary | ICD-10-CM

## 2019-02-04 DIAGNOSIS — Z872 Personal history of diseases of the skin and subcutaneous tissue: Secondary | ICD-10-CM

## 2019-02-04 DIAGNOSIS — I480 Paroxysmal atrial fibrillation: Secondary | ICD-10-CM
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
## 2019-02-06 ENCOUNTER — Telehealth: Payer: BLUE CROSS/BLUE SHIELD

## 2019-02-06 NOTE — Telephone Encounter
Appointment Accommodation Request    MD Name: Dr. Winnifred Friar     Appointment Type: New    Reason for sooner request: Skin lesion, H/O melanoma in situ, H/O actinic keratosis    Date/Time Requested (If any):  Any day or time besides the following dates.    Patient stated he is not available for 06/22, 07/06, 07/07    Last seen by MD: New    Any Symptoms:  []  Yes  [x]  No      ? If yes, what symptoms are you experiencing:   o Duration of symptoms (how long):     Patient was offered an appointment but declined.    Patient was advised to seek emergency services if conditions are urgent or emergent.    Patient has been notified of the 24-48 hour turnaround time.

## 2019-02-26 ENCOUNTER — Ambulatory Visit: Payer: PRIVATE HEALTH INSURANCE

## 2019-02-26 DIAGNOSIS — D492 Neoplasm of unspecified behavior of bone, soft tissue, and skin: Secondary | ICD-10-CM

## 2019-02-26 DIAGNOSIS — I781 Nevus, non-neoplastic: Secondary | ICD-10-CM

## 2019-02-26 DIAGNOSIS — L570 Actinic keratosis: Secondary | ICD-10-CM

## 2019-02-26 DIAGNOSIS — L821 Other seborrheic keratosis: Secondary | ICD-10-CM

## 2019-02-26 DIAGNOSIS — Z85828 Personal history of other malignant neoplasm of skin: Secondary | ICD-10-CM

## 2019-02-26 NOTE — Progress Notes
- Patient Exam Follow-Up    ???  Chief complaint: skin check  ???  History of present illness:  Calvin Chen, a 63 y.o. year old male here for dermatology return visit.   ?-- complains of non healing growth(s)    Duration:  months  Location:  face  Symptoms:  scaling  Treatment tried: none  Alleviating/exacerbating factors: none    ???PMH/Meds/All/Soc/Fam hx:   No new changes to PMH/Meds/All/Soc/Fam hx - please refer to today's intake form.  Patient Active Problem List   Diagnosis   ??? Atrial fibrillation (HCC/RAF)   ??? Benign prostatic hyperplasia   ??? Prediabetes   ??? Erectile dysfunction   ??? HTN (hypertension)   ??? Hyperlipidemia   ??? Long term current use of anticoagulant   ??? Obstructive sleep apnea   ??? Obesity   ??? Urinary tract infectious disease   ??? Hypertensive urgency     Outpatient Medications Prior to Visit   Medication Sig   ??? ATORVASTATIN 20 mg tablet TAKE 1 TABLET DAILY   ??? lisinopril-hydroCHLOROthiazide 20-25 mg tablet Take 2 tablets by mouth daily.   ??? metoprolol tartrate 25 mg tablet Take 1 tablet (25 mg total) by mouth two (2) times daily.   ??? oxyCODONE 5 mg tablet Take 1 tablet (5 mg total) by mouth every four (4) hours as needed for Moderate Pain (Pain Scale 4-6). Max Daily Amount: 30 mg   ??? polyethylene glycol powder Take 17 g by mouth daily as needed (constipation). Dissolve in 6-8 oz water, juice, tea, beverage of choice.   ??? rivaroxaban (XARELTO) 20 mg tablet Take 1 tablet (20 mg total) by mouth daily with dinner.   ??? senna 8.6 mg tablet Take 1 tablet by mouth two (2) times daily as needed for Constipation.     No facility-administered medications prior to visit.      Allergies   Allergen Reactions   ??? Sulfa Antibiotics Rash     Bactrim  Diffuse maculopapular rash, fever   ??? Naproxen Rash     ???  Review of systems:   Constit: The patient feels well.  ENMT: Not assessed  Endo: Not assessed  MS:  Not assessed  Skin otherwise negative  ???  Objective: General appearance: well-developed/well nourished for stated age, no apparent distress.  Mental status: alert and oriented.   Mood: cooperative, pleasant.  Affect: normal    Skin Exam:  Head, face examined  Conjunctiva/lids examined  Neck examined  Chest, breast axilla examined  Abd examined  Back examined  RUE examined  LUE examined  RLE examined  LLE examined  Buttocks examined  Scalp, hair examined    All areas examined were within normal limits with the following exceptions:     Hyperkeratotic plaque-right nasal sidewall  Pink scaly papule(s)- left cheek, left forearm  rough, pebbly stuck on brown papule(s)- trunk and extremities   Red papules- trunk and extremities   Firm red papule- left upper arm    Assessment/Plan:    1 . Neoplasm of unspecified behavior  --Shave biopsy to rule out SCC  --Location: right nasal sidewall (partial lesion(s) sampled)   --Risks (including scar), benefits, alternatives discussed.  Recommended biopsy for diagnostic purposes.  Patient verbalized consent for skin biopsy and photography of the lesion.  Procedural pause was performed.  Bandage with ointment applied.  Wound care instructions given.  No complications.  Specimen sent to pathology.  The patient was informed that the pathology result will be communicated to the patient  in 10-14 days, beyond which the patient should call back to the office to inquire about the pathology result. (above applies for all biopsies if multiple ones performed)    3. Actinic Keratosis  --Liquid nitrogen applied directly to 4 lesions on the left cheek, left forearm.   --Risks/benefits/alternatives of therapy discussed including pain, redness, scaling, scarring, bleeding, crusting, blistering, pigment change, recurrence and potential need for additional treatment were discussed with the patient; tolerated the procedure well with no immediate complications.   --The patient was encouraged to follow up in 4 weeks for re-evaluation and possible additional treatment if the lesions do not resolve.       4. Seborrheic Keratoses  --Reassurance as to the benign nature of the lesion.      5. Cherry Angioma  --Reassurance as to the benign nature of the lesion.      6. Neoplasm of unspecified behavior  --Shave biopsy to rule out DF vs BCC  --Location: left upper arm (partial lesion(s) sampled)   --Risks (including scar), benefits, alternatives discussed.  Recommended biopsy for diagnostic purposes.  Patient verbalized consent for skin biopsy and photography of the lesion.  Procedural pause was performed.  Bandage with ointment applied.  Wound care instructions given.  No complications.  Specimen sent to pathology.  The patient was informed that the pathology result will be communicated to the patient in 10-14 days, beyond which the patient should call back to the office to inquire about the pathology result. (above applies for all biopsies if multiple ones performed)       Sun protection strategies and skin cancer risk factors reviewed with patient.   ???  Inez Pilgrim, MD  Division of Dermatology

## 2019-02-26 NOTE — Patient Instructions
Post-Biopsy Care Instructions    You have had a biopsy taken of your skin. Follow these instructions to care for your biopsy site.     Twice a day:     1. Clean the wound with soap and warm water.     2. Pat dry.     3. Apply Aquaphor or Vaseline.     4. Cover with a Band-aid.     Repeat these instructions for 5-7  days or until you return to our office.             [facility]  [phone]    After Hours:  Please call the page operator and ask for the Dermatologist on call: [phone]

## 2019-03-03 LAB — Tissue Exam

## 2019-03-13 ENCOUNTER — Ambulatory Visit: Payer: Commercial Managed Care - Pharmacy Benefit Manager

## 2019-03-13 DIAGNOSIS — C44311 Basal cell carcinoma of skin of nose: Secondary | ICD-10-CM

## 2019-03-13 NOTE — Progress Notes
Pre-op Diagnosis:  Basal cell carcinoma, nodular type with squamous differentiation and infiltrative features      Anatomic Location: right nasal sidewall      Pre-op Size: 0.4 cm x 0.3 cm    Post-op Diagnosis:  Basal cell carcinoma, nodular type with squamous differentiation and infiltrative features      Post-op Size: 1.1 cm x 1.0 cm    Procedure:  Surgical excision of a basal cell carcinoma with continuous microscopic control (Mohs micrographic technique).    Surgeon: Leroy Sea, M.D.    Assistant Surgeon: none    Anesthesia:  Lidocaine 1% with epinephrine.    Indications:  Because of the pathology and location of the tumor, Mohs micrographic surgery is indicated to allow for complete removal of the skin tumor with maximal preservation of tissue and to minimize risk for recurrence.      Preparation:  The diagnosis, procedure, risks, benefits and alternative procedures, and consequence of refusal of treatment were discussed with the patient prior to the administration of any medication.  All of the patient???s questions were answered.  Proper informed consent for the surgery and photography were obtained.    Procedure: The procedure consisted of excision of layers of tissue containing tumor and systematic examination of frozen sections.  This enables [place] to trace out the tumor extensions, sacrificing the minimal amount of normal tissue and, at the same time, achieving optimal cure rate.  At each stage, the patient was taken to the surgical suite, placed in the supine position, and was prepped and draped in the usual manner.  The area to be excised was outlined and was anesthetized with local anesthesia.    Stage I: Curettage was performed to further delineate extent of the tumor margins and surgically debulk the tumor.  A layer of tissue encompassing the previously debulked area was surgically excised in a tangential plane with a No. 15 blade. Hemostasis was achieved with biterminal electrocoagulation and/or suture ligatures and a dry pressure dressing was used to cover the surgical defect.  The patient was then escorted to the waiting room.    A reference map was drawn to indicate the orientation of the excised tissue.  The surgical specimen was cut into 1 sections, and the edges of each section were colored with separated dyes so that the precise orientation was achieved.  Each section was numbered and each number corresponded to a section on the map. The tissue sections were frozen, processed in accordance with the Mohs histological technique, and stained with hematoxylin and eosin.    The prepared microscopic sections were examined.  Areas of residual tumor were identified (see Mohs pathology card) and marked on the reference map to indicate where further surgery was necessary.  The patient was informed of the findings and prepared in the usual manner for the next stage of Mohs micrographic surgery.    Stage II:  A layer of tissue encompassing areas of residual tumor was surgically excised in a tangential plane with a No. 15 blade. Hemostasis was achieved with biterminal electrocoagulation and/or suture ligatures and a dry pressure dressing was used to cover the surgical defect.  The patient was then escorted to the waiting room.    A reference map was drawn to indicate the orientation of the excised tissue.  The surgical specimen was cut into 1 sections, and the edges of each section were colored with separated dyes so that the precise orientation was achieved.  Each section was numbered and each number corresponded  to a section on the map. The tissue sections were frozen, processed in accordance with the Mohs histological technique, and stained with hematoxylin and eosin.    The prepared microscopic sections were examined.  Evaluation of the microscopic sections revealed tumor-free margins had been achieved.    Estimated Blood Loss: Minimal    Complications: None Condition of Patient After Surgery: Satisfactory     Disposition of Surgical Defect:  It was determined that the wound site would be best managed by adjacent tissue transfer/skin flap due to the complexity of the repair in order to preserve proper anatomic structure and function of the area.     Disposition of Patient:  The patient was discharged to home with written postoperative dressing instructions as well as doctor's [facility] On Call Service pager number and instructed to return to our office in 7 days.    Postoperative Medications: none.    Informed Consent Discussion:     The diagnosis, procedures, risks, benefits, and alternative procedures, and consequence of refusal of treatment were discussed with the patient.  All of the patient???s questions were addressed.  The patient understands and has agreed to proceed with the procedure.  Informed consent for the surgery and photography was obtained.       Diagnosis: BCC, nodular type with squamous differentiation and infiltrative features    1.     Mohs micrographic surgery defect, right nasal sidewall  , measuring 1.1 cm x 1.0 cm  2.     History of BCC of right nasal ala status post Mohs surgery.    Procedure:     1.     Local soft tissue rearrangement of right nasal sidewall  2.     Closure of Mohs surgery defect    Local Anesthesia:  1% lidocaine with epinephrine     Details of Procedure: The patient was placed in the supine position and prepped and draped in usual fashion. A advancement flap was outlined adjacent to the surgical defect.  Local anesthesia was infiltrated into the skin adjacent to the Mohs defect.   Skin edges were undermined circumferentially to allow movement of flap.  Additional incisions were made.The flap was elevated and mobilized to cover the surgical defect.  Standing cones were excised.  Hemostasis was achieved.  The flap was secured into place using 5-0 monocryl interrupted subcutaneous sutures followed by 5-0 fast absorbing gut interrupted superficial sutures.  The final size of the flap measured *** cm x *** cm.  The patient tolerated procedure well.  A pressure dressing was applied.      Specimen(s):  None     Complication(s): None    Estimated Blood Loss: Minimal      Disposition of patient: Wound care was discussed and post-operative written instructions given to the patient. The patient will follow-up in dermatologic surgery clinic in 7 days for suture removal.

## 2019-03-13 NOTE — Patient Instructions
Patient given verbal and paper wound care instructions. Patient expressed understanding.     Post-Operative Wound Care Instructions  [facility]  [facility]  [address] [place] [address]  [place] [address]  Tel: [phone]    1. For the first 24 hours after surgery, do not get the wound wet and keep the dressing as it is, do not remove.  2. After 24 hours, you may get your wound wet (i.e. take a shower, etc.). At this point you must start changing your wound dressing 2 times a day:   a. Carefully remove the old dressing.  b. Cleanse wound with a mild soap and water.   c. Dry wound with gauze and apply Vaseline ointment or Aquaphor  using cotton swabs.  d. Cover with Telfa pads  (non-stick Dressing or non-adherent Dressing)  cut to size. If there is any oozing/draining you may also add some gauze cut to size.  e. Then secure the dressing in place with paper tape.    3. Take Tylenol (acetominophen) for any discomfort. Avoid Aspirin (acetylsalicylic acid) or any product containing Aspirin (Anacin, etc.). Avoid alcohol for 5 days post-operatively.  4. If any bleeding should occur, apply 20 minutes of constant pressure. If the situation persists, please call us immediately.   5. For several days after surgery, local swelling and drainage of clear or blood-tinged fluid from the wound may occur. If there is persisting noticeable redness, swelling, pain and/or pus after 3 to 4 days, you may have an infection. Please don???t hesitate to call us.  6. Avoid any strenuous exercise or activity (i.e. bending, lifting heavy objects) that could be harmful to the wound for at least three weeks.  7. If surgery was performed around the lips or cheeks, minimize activity for 2-3 weeks. (i.e. excessive laughing, smiling, eating any hard foods (steak, apples, etc.) should be avoided)  8. If surgery was performed on lower extremities, the leg needs to be elevated as much as possible. Activity and weight bearing the feet should be minimized.  9. Avoid lying or sleeping on the side where surgery was performed.    10. When shaving, leave your bandage on and shave around it.  If you shave over the sutures, you will cut them and risk the wound opening and/or infection     Wound care:    7         Days      Office: [phone]    Nights and weekends, Contact the Mohs  Micrographic Surgery and Dermatologic Oncology Fellow    Marice Potter, M.D.  [facility] pager [phone]

## 2019-05-01 MED ORDER — LISINOPRIL-HYDROCHLOROTHIAZIDE 20-25 MG PO TABS
ORAL_TABLET | 3 refills
Start: 2019-05-01 — End: ?

## 2019-05-02 MED ORDER — LISINOPRIL-HYDROCHLOROTHIAZIDE 20-25 MG PO TABS
ORAL_TABLET | 0 refills | Status: AC
Start: 2019-05-02 — End: ?

## 2019-05-05 ENCOUNTER — Telehealth: Payer: BLUE CROSS/BLUE SHIELD

## 2019-05-05 DIAGNOSIS — E785 Hyperlipidemia, unspecified: Secondary | ICD-10-CM

## 2019-05-05 DIAGNOSIS — I480 Paroxysmal atrial fibrillation: Secondary | ICD-10-CM

## 2019-05-05 DIAGNOSIS — I10 Essential (primary) hypertension: Secondary | ICD-10-CM

## 2019-05-05 MED ORDER — RIVAROXABAN 20 MG PO TABS
20 mg | ORAL_TABLET | Freq: Every day | ORAL | 3 refills | 30.00000 days | Status: AC
Start: 2019-05-05 — End: ?

## 2019-05-05 NOTE — Patient Instructions
Restart xerelto.  Please let me know if the medication is not covered by the insurance.  Please make an appointment with the cardiologist for follow up.

## 2019-05-05 NOTE — Progress Notes
[facility]     Video Visit    Patient Consent to Telehealth Questionnaire   [facility] TELEHEALTH PRECHECKIN QUESTIONS 05/05/2019   By clicking ''I Agree'', I consent to the below:  I Agree     - I agree  to be treated via a video visit and acknowledge that I may be liable for any relevant copays or coinsurance depending on my insurance plan.  - I understand that this video visit is offered for my convenience and I am able to cancel and reschedule for an in-person appointment if I desire.  - I also acknowledge that sensitive medical information may be discussed during this video visit appointment and that it is my responsibility to locate myself in a location that ensures privacy to my own level of comfort.  - I also acknowledge that I should not be participating in a video visit in a way that could cause danger to myself or to those around me (such as driving or walking).  If my provider is concerned about my safety, I understand that they have the right to terminate the visit.       Patient:  Calvin Chen  Medical record number:  [id number]  Date of birth:  06/12/1956  Date of service:  05/05/2019    Cc: follow up     SUBJECTIVE:     Calvin Chen is a 63 y.o. male who wishes to discuss:    1. Hypertension : lisinopril/hct 20/25mg  daily and metoprolol 25mg  bid.    Denies chest pain.  Overall feels well  Some stress due to pandemic  No dizziness     2. Atrial fibrillation: not taking xerelto due to cost.    Not taking any anticoagulation for a number of months.  Does have pills left over.      3. Hyperlipidemia : taking atorvastatin 40mg  daily.      4.  Shortness of breath after a long bike ride 4 days ago: symptoms lasted for a couple days.  No chest pain.  No dizziness.  Improved when staying inside.  Patient feels his symptoms were due to the air quality.  Completely resolved now.  Symptoms lasted for 2 days.      5.  Prediabetes: last hgba1c in dm range.  Not doing that well with diet and exercise.    Lab Results Component Value Date    HGBA1C 6.7 (H) 01/20/2018           Patient Active Problem List   Diagnosis   ? Atrial fibrillation (HCC/RAF)   ? Benign prostatic hyperplasia   ? Prediabetes   ? Erectile dysfunction   ? HTN (hypertension)   ? Hyperlipidemia   ? Long term current use of anticoagulant   ? Obstructive sleep apnea   ? Obesity   ? Urinary tract infectious disease   ? Hypertensive urgency       Medication:  Outpatient Medications Prior to Visit   Medication Sig   ? ATORVASTATIN 20 mg tablet TAKE 1 TABLET DAILY   ? LISINOPRIL-HYDROCHLOROTHIAZIDE 20-25 mg tablet TAKE 2 TABLETS DAILY   ? metoprolol tartrate 25 mg tablet Take 1 tablet (25 mg total) by mouth two (2) times daily.   ? oxyCODONE 5 mg tablet Take 1 tablet (5 mg total) by mouth every four (4) hours as needed for Moderate Pain (Pain Scale 4-6). Max Daily Amount: 30 mg   ? polyethylene glycol powder Take 17 g by mouth daily as needed (constipation). Dissolve  in 6-8 oz water, juice, tea, beverage of choice.   ? senna 8.6 mg tablet Take 1 tablet by mouth two (2) times daily as needed for Constipation.   ? rivaroxaban (XARELTO) 20 mg tablet Take 1 tablet (20 mg total) by mouth daily with dinner.     No facility-administered medications prior to visit.        Allergies:   Allergies   Allergen Reactions   ? Sulfa Antibiotics Rash     Bactrim  Diffuse maculopapular rash, fever   ? Naproxen Rash       Social History     Tobacco Use   ? Smoking status: Never Smoker   ? Smokeless tobacco: Never Used   Substance Use Topics   ? Alcohol use: Yes     Alcohol/week: 2.4 oz     Types: 4 Glasses of Wine (5 oz) per week          Family History   Problem Relation Age of Onset   ? Diabetes Mother    ? Heart disease Mother    ? Other (mitral valve disease) Brother        Review of Systems: see HPI    PHYSICAL EXAM:     Gen: No acute distress, alert & oriented, pleasant and cooperative  Well appearing, speaking in complete sentences    STUDIES: Relevant labs and imaging were reviewed today by me in conjunction with the patient.      ASSESSMENT & PLAN:       Diagnoses and all orders for this visit:    Essential hypertension  -     Comprehensive Metabolic Panel; Future    Paroxysmal atrial fibrillation (HCC/RAF)  -     rivaroxaban (XARELTO) 20 mg tablet; Take 1 tablet (20 mg total) by mouth daily with dinner.  -     Comprehensive Metabolic Panel; Future  -     CBC & Auto Differential; Future  -      Cardiology    Hyperlipidemia, unspecified hyperlipidemia type  -     TSH with reflex FT4, FT3; Future  -     Lipid Panel; Future    Prediabetes  -     Hgb A1c; Future      Explained importance of blood thinner to patient   Offered coumadin - patient wishes to defer due to bw and visits that are required.  States he will restart with tablets that he has  Will send new prescription  If not covered by insurance will change medication.  F/u with cardiology  Make appointment for nurse visit     Counseled on diet and exercise    Patient Instructions   Restart xerelto.  Please let me know if the medication is not covered by the insurance.  Please make an appointment with the cardiologist for follow up.          Call or return to clinic prn if these symptoms worsen or fail to improve as anticipated.     The above plan was explained and reviewed extensively with the patient.  All questions were answered.     Veronda Prude, MD  Family Medicine  [facility]

## 2019-06-08 ENCOUNTER — Ambulatory Visit: Payer: BLUE CROSS/BLUE SHIELD

## 2019-06-08 DIAGNOSIS — Z125 Encounter for screening for malignant neoplasm of prostate: Secondary | ICD-10-CM

## 2019-06-08 DIAGNOSIS — N200 Calculus of kidney: Secondary | ICD-10-CM

## 2019-06-08 MED ORDER — POTASSIUM CITRATE ER 10 MEQ (1080 MG) PO TBCR
10 meq | ORAL_TABLET | Freq: Two times a day (BID) | ORAL | 3 refills | 30.00000 days | Status: AC
Start: 2019-06-08 — End: ?

## 2019-06-08 NOTE — Progress Notes
PATIENT:  Calvin Chen   MRN:  [id number]  DOB:  [birth year]  DATE OF SERVICE:  06/08/2019  PRIMARY CARE PROVIDER: Veronda Prude, MD    CHIEF COMPLAINT:   Nephrolithiasis      HISTORY OF PRESENT ILLNESS     Calvin Chen is a 63 y.o. male with history of sleep apnea, HTN, and A-fib, who is followed for management of nephrolithiasis. No previous history of stones. On 05/19/18, patient presented to [facility] ED for sharp, RLQ abdominal pain with radiation to the back +N/V/dysuria. Urine microscopy identified microhematuria, and CT A/P 05/19/18 showed a right horseshoe kidney with a 3 mm distal ureteral stone. He was admitted to the ICU until 05/22/18 for further care and IV ceftriaxone. Nifedipine TID was added to his HTN regimen, and apixaban was added for anticoagulation prior to discharge. He eventually passed the stone and brought it in to his PCP. Stone analysis 05/26/18 revealed 90% uric acid and 10% CaOx composition.     During visit on 06/06/18, he reported difficulty urinating, weak FOS, and hematospermia. Previously followed by a urologist at [facility] for nocturia, and had been on flomax which did not improve his voiding symptoms. Here today for annual follow-up and PSA check. No recent renal colic or stone passage. He has not been taking KCitrate.     PAST MEDICAL HISTORY     Past Medical History:   Diagnosis Date   ? Hyperlipidemia 2009   ? Hypertension 2009   ? Sleep apnea 2014    Use APAP     PAST SURGICAL HISTORY     Past Surgical History:   Procedure Laterality Date   ? none       MEDICATIONS     Current Outpatient Medications   Medication Sig   ? ATORVASTATIN 20 mg tablet TAKE 1 TABLET DAILY   ? LISINOPRIL-HYDROCHLOROTHIAZIDE 20-25 mg tablet TAKE 2 TABLETS DAILY   ? metoprolol tartrate 25 mg tablet Take 1 tablet (25 mg total) by mouth two (2) times daily.   ? oxyCODONE 5 mg tablet Take 1 tablet (5 mg total) by mouth every four (4) hours as needed for Moderate Pain (Pain Scale 4-6). Max Daily Amount: 30 mg ? polyethylene glycol powder Take 17 g by mouth daily as needed (constipation). Dissolve in 6-8 oz water, juice, tea, beverage of choice.   ? rivaroxaban (XARELTO) 20 mg tablet Take 1 tablet (20 mg total) by mouth daily with dinner.   ? senna 8.6 mg tablet Take 1 tablet by mouth two (2) times daily as needed for Constipation.     No current facility-administered medications for this visit.         ALLERGIES     Allergies   Allergen Reactions   ? Sulfa Antibiotics Rash     Bactrim  Diffuse maculopapular rash, fever   ? Naproxen Rash        SOCIAL HISTORY     Social History     Socioeconomic History   ? Marital status: Married     Spouse name: Not on file   ? Number of children: Not on file   ? Years of education: Not on file   ? Highest education level: Not on file   Occupational History   ? 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 Financial resource strain: Not on file   ? Food insecurity:     Worry: Not on file  Inability: Not on file   ? Transportation needs:     Medical: Not on file     Non-medical: Not on file   Tobacco Use   ? Smoking status: Never Smoker   ? Smokeless tobacco: Never Used   Substance and Sexual Activity   ? Alcohol use: Yes     Alcohol/week: 2.4 oz     Types: 4 Glasses of Wine (5 oz) per week   ? Drug use: No   ? Sexual activity: Yes     Partners: Female   Lifestyle   ? Physical activity:     Days per week: Not on file     Minutes per session: Not on file   ? Stress: Not on file   Relationships   ? Social connections:     Talks on phone: Not on file     Gets together: Not on file     Attends religious service: Not on file     Active member of club or organization: Not on file     Attends meetings of clubs or organizations: Not on file     Relationship status: Not on file   Other Topics Concern   ? Do you exercise at least a day, 3 or more days a week? Yes   ? Types of Exercise? (List in Comments) Yes Comment: Walking, cycling, gym   ? Do you follow a special diet? No   ? Vegan? No   ? Vegetarian? No   ? Pescatarian? No   ? Lactose Free? No   ? Gluten Free? No   ? Omnivore? Yes   Social History Narrative    Diet: needs work    Exercise: has been increasing his level - walks daily; cycles 1-2 times a week, gym 1-2 times per week     Sleep: 6 hours last night      Occupation: Clinical research associate    FAMILY HISTORY     Family History   Problem Relation Age of Onset   ? Diabetes Mother    ? Heart disease Mother    ? Other (mitral valve disease) Brother         REVIEW OF SYSTEMS     Review of Systems: A detailed 14 point review of systems was ascertained today including: endocrine, integument, cardiovascular, pulmonary, GI, urinary, hematologic, neurologic/psychiatric. No pertinent positives other than those listed per HPI.     VITALS:   There were no vitals filed for this visit.    PHYSICAL EXAM     General:  The patient is a well-developed and well nourished male in no acute distress with normal attention to grooming. The patient is awake, alert and cooperative.    HEENT: normocephalic, PERRLA, normal ear and nose structures, with no scars or lesions.    Skin: warm, dry with normal turgor, with no lesions seen.    Neck: supple, no bruits, no lymph nodes palpable.    Lungs: normal respiratory effort.    Heart: regular rate and rhythym.     Abdomen:  The patient has no palpable masses. The abdomen has no tenderness. No hepatosplenomegaly. no hernia.    GU (06/06/18): 30-40 gram prostate. Firm, no nodules.     Back: no CVA tenderness.     Suprapubic area: no tenderness over the bladder. Nondistended.    Extremities: Upper with no deformities. Lower - no edema.    Neurologic: The patient is oriented to person, place and time, and has normal mood  and affect. The patient is dressed appropriately for the weather.    LABORATORY DATA     Lab Results   Component Value Date    WBC 11.11 (H) 05/22/2018    HGB 13.0 (L) 05/22/2018 HCT 40.1 05/22/2018    MCV 84.1 05/22/2018    PLT 273 05/22/2018       Lab Results   Component Value Date    NA 137 05/26/2018     Lab Results   Component Value Date    K 4.8 05/26/2018     Lab Results   Component Value Date    CL 99 05/26/2018     Lab Results   Component Value Date    CO2 23 05/26/2018     Lab Results   Component Value Date    BUN 18 05/26/2018     Lab Results   Component Value Date    CREAT 1.23 05/26/2018     Lab Results   Component Value Date    GLUCOSE 92 05/26/2018     PSA:  03/18/2017 1.3  05/23/2015 1.2  01/12/2014 1.2    Urinalysis:  Results for orders placed or performed during the hospital encounter of 05/18/18   UA,Dipstick   Result Value Ref Range    Urine Color Straw      Specific Gravity 1.012 1.005 - 1.030    pH,Urine 8.0 5.0 - 8.0    Blood 2+ (A) Negative    Bilirubin Negative Negative    Ketones Negative Negative    Glucose 1+ (A) Negative    Protein 2+ (A) Negative    Leukocyte Esterase Negative Negative    Nitrite Negative Negative     Results for orders placed or performed during the hospital encounter of 05/18/18   UA,Microscopic   Result Value Ref Range    RBC per uL 331 (H) 0 - 11 cells/uL    WBC per uL 8 0 - 22 cells/uL    RBC per HPF 70 (H) 0 - 2 cells/HPF    WBC per HPF 2 0 - 4 cells/HPF    Squamous Epi Cells 2 0 - 17 cells/uL     Urine Cultures:   Component Bacterial Culture Urine   05/19/2018 No Growth at 1:1000 dilution     05/26/18 Stone Analysis:  Calculi composed primarily of:   10% calcium oxalate dihydrate, and   90% uric acid.     Procedures:  06/06/18 post void residual 17cc    IMAGING     05/19/18 CT A/P w contrast   Horseshoe kidney with 3 mm stone in the distal right ureter with associated mild hydronephrosis and heterogeneous parenchymal enhancement, suspicious for pyelonephritis.  Bladder: Unremarkable.  Reproductive organs: Mild prostatomegaly.    05/20/18 US Renal   Mild dilation of the right collecting system. No shadowing stones seen.    IMPRESSION 63 y.o. male with history of sleep apnea, HTN, and A-fib, who was admitted to [facility] from 05/19/18 to 05/22/18 for right abdominal pain, found to have microhematuria, leukocytosis, right horseshoe kidney, and a 3mm distal right ureteral stone. He passed the stone, and analysis revealed 90% uric acid and 10% CaOx composition. I would like to start him on KCitrate 10 mEq BID to prevent future uric acid stones. He does not have any recent imaging nor a PSA since 2009, so we will repeat and obtain both.     RECOMMENDATION     -check PSA (lab order placed)  -start KCitrate 10  mEq BID   -Renal US next available to check for new stones; will notify patient of results via MyChart        At least 45 minutes of coordinated time was spent with the patient, of which greater than 50% was spent in counseling, coordination of care and a detailed question and answer session.       SCRIBE ATTESTATION:  Maurie Boettcher, have assisted Dr. Madlyn Frankel. Dunn, with the documentation for Gurkaran Schuch on 06/08/2019 8:42 AM.    This note was dictated by me to scribe directly and therefore I formulated entire note including history, physical exam, review of systems and assessment/plan.      PROVIDER SIGNATURE:    Madlyn Frankel. Shea Evans, MD  Department of Urology  [place] [facility] [place], [place]    CC:  Veronda Prude, MD  [id number]  [place] [address]    Tel: [phone]  Fax: [phone]

## 2019-06-12 ENCOUNTER — Ambulatory Visit: Payer: BLUE CROSS/BLUE SHIELD

## 2019-06-12 DIAGNOSIS — I480 Paroxysmal atrial fibrillation: Secondary | ICD-10-CM

## 2019-06-12 MED ORDER — RIVAROXABAN 20 MG PO TABS
20 mg | ORAL_TABLET | Freq: Every day | ORAL | 1 refills | 30.00000 days | Status: AC
Start: 2019-06-12 — End: ?

## 2019-06-15 ENCOUNTER — Institutional Professional Consult (permissible substitution): Payer: PRIVATE HEALTH INSURANCE

## 2019-06-15 DIAGNOSIS — I480 Paroxysmal atrial fibrillation: Secondary | ICD-10-CM

## 2019-06-15 DIAGNOSIS — E785 Hyperlipidemia, unspecified: Secondary | ICD-10-CM

## 2019-06-15 DIAGNOSIS — R7303 Prediabetes: Secondary | ICD-10-CM

## 2019-06-15 DIAGNOSIS — I10 Essential (primary) hypertension: Secondary | ICD-10-CM

## 2019-06-16 ENCOUNTER — Ambulatory Visit: Payer: BLUE CROSS/BLUE SHIELD

## 2019-06-16 DIAGNOSIS — N289 Disorder of kidney and ureter, unspecified: Secondary | ICD-10-CM

## 2019-06-16 LAB — Differential Automated: BASOPHIL PERCENT, AUTO: 0.3 (ref 0.00–0.10)

## 2019-06-16 LAB — CBC: RED BLOOD CELL COUNT: 5.37 x10E6/uL (ref 4.41–5.95)

## 2019-06-16 LAB — TSH with reflex FT4, FT3: TSH: 0.76 u[IU]/mL (ref 0.3–4.7)

## 2019-06-16 LAB — Lipid Panel: CHOLESTEROL: 182 mg/dL (ref >40–<130)

## 2019-06-16 LAB — Comprehensive Metabolic Panel
CREATININE: 1.35 mg/dL — ABNORMAL HIGH (ref 0.60–1.30)
TOTAL PROTEIN: 7.1 g/dL (ref 6.1–8.2)

## 2019-06-16 LAB — Hgb A1c: HGB A1C - HPLC: 6.3 — ABNORMAL HIGH (ref <5.7)

## 2019-07-30 MED ORDER — LISINOPRIL-HYDROCHLOROTHIAZIDE 20-25 MG PO TABS
ORAL_TABLET | 3 refills
Start: 2019-07-30 — End: ?

## 2019-07-31 MED ORDER — LISINOPRIL-HYDROCHLOROTHIAZIDE 20-25 MG PO TABS
ORAL_TABLET | 1 refills | Status: AC
Start: 2019-07-31 — End: ?

## 2019-08-02 MED ORDER — METOPROLOL TARTRATE 25 MG PO TABS
ORAL_TABLET | 3 refills
Start: 2019-08-02 — End: ?

## 2019-08-03 MED ORDER — METOPROLOL TARTRATE 25 MG PO TABS
ORAL_TABLET | 0 refills | Status: AC
Start: 2019-08-03 — End: ?

## 2019-09-14 ENCOUNTER — Telehealth: Payer: BLUE CROSS/BLUE SHIELD

## 2019-09-15 NOTE — Telephone Encounter
Faxed signed rx to [facility] and received confirmation via the fax machine.

## 2019-11-20 ENCOUNTER — Ambulatory Visit: Payer: Commercial Managed Care - Pharmacy Benefit Manager

## 2019-11-20 DIAGNOSIS — Z23 Encounter for immunization: Secondary | ICD-10-CM

## 2019-11-24 ENCOUNTER — Ambulatory Visit: Payer: PRIVATE HEALTH INSURANCE

## 2019-11-24 DIAGNOSIS — Z23 Encounter for immunization: Secondary | ICD-10-CM

## 2019-12-09 MED ORDER — RIVAROXABAN 20 MG PO TABS
20 mg | ORAL_TABLET | Freq: Every day | ORAL | 0 refills | Status: AC
Start: 2019-12-09 — End: ?

## 2019-12-24 ENCOUNTER — Ambulatory Visit: Payer: PRIVATE HEALTH INSURANCE

## 2019-12-24 DIAGNOSIS — Z125 Encounter for screening for malignant neoplasm of prostate: Secondary | ICD-10-CM

## 2019-12-24 DIAGNOSIS — I10 Essential (primary) hypertension: Secondary | ICD-10-CM

## 2019-12-24 DIAGNOSIS — L989 Disorder of the skin and subcutaneous tissue, unspecified: Secondary | ICD-10-CM

## 2019-12-24 DIAGNOSIS — Z1211 Encounter for screening for malignant neoplasm of colon: Secondary | ICD-10-CM

## 2019-12-24 DIAGNOSIS — E785 Hyperlipidemia, unspecified: Secondary | ICD-10-CM

## 2019-12-24 NOTE — Progress Notes
[place] [facility]     Primary Care Progress Note        Patient:  Calvin Chen  Medical record number:  [id number]  Date of birth:  12-Sep-1955  Date of service:  12/24/2019    Chief Complaint   Patient presents with   ??? Referrals     Derm       BP 143/83  ~ Pulse 50  ~ Temp 36.8 ???C (98.2 ???F) (Forehead)  ~ Wt 195 lb (88.5 kg)  ~ BMI 30.54 kg/m???     SUBJECTIVE:     Calvin Chen is a 64 y.o. male who presents for :    1. htn: doing well on current meds  No chest pain or shortness of breath     2. Prediabetes : walking or biking every day  Working on losing weight     3. hyperlipidemia: taking atorvastatin 20mg  daily     4.  Sleep apnea: using cpap machine   Wakes up at 1am, difficulty getting back to sleep for the last couple weeks     5.  Atrial fibrillation: taking xarelto daily     6.  Requesting derm referral for skin check       Patient Active Problem List   Diagnosis   ??? Atrial fibrillation (HCC/RAF)   ??? Benign prostatic hyperplasia   ??? Prediabetes   ??? Erectile dysfunction   ??? HTN (hypertension)   ??? Hyperlipidemia   ??? Long term current use of anticoagulant   ??? Obstructive sleep apnea   ??? Obesity   ??? Urinary tract infectious disease   ??? Hypertensive urgency       Medication:  Medications that the patient states to be currently taking   Medication Sig   ??? ATORVASTATIN 20 mg tablet TAKE 1 TABLET DAILY   ??? LISINOPRIL-HYDROCHLOROTHIAZIDE 20-25 mg tablet TAKE 2 TABLETS DAILY   ??? METOPROLOL TARTRATE 25 mg tablet TAKE 1 TABLET TWICE A DAY   ??? rivaroxaban (XARELTO) 20 mg tablet Take 1 tablet (20 mg total) by mouth daily with dinner Please make appointment for follow up.   ??? senna 8.6 mg tablet Take 1 tablet by mouth two (2) times daily as needed for Constipation.       Allergies:   Allergies   Allergen Reactions   ??? Sulfa Antibiotics Rash     Bactrim  Diffuse maculopapular rash, fever   ??? Naproxen Rash       Social History     Tobacco Use   ??? Smoking status: Never Smoker   ??? Smokeless tobacco: Never Used   Substance Use Topics ??? Alcohol use: Yes     Alcohol/week: 2.4 oz     Types: 4 Glasses of Wine (5 oz) per week          Family History   Problem Relation Age of Onset   ??? Diabetes Mother    ??? Heart disease Mother    ??? Other (mitral valve disease) Brother        Review of Systems: see HPI  No cough  No shortness of breath  No sore throat  No fever or chills   No diarrhea   No muscle pain  No nausea or vomiting   No change in taste or smell      PHYSICAL EXAM:     BP 143/83  ~ Pulse 50  ~ Temp 36.8 ???C (98.2 ???F) (Forehead)  ~ Wt 195 lb (88.5 kg)  ~ BMI  30.54 kg/m???   GEN: WDWN. NAD.  SKIN: Warm, dry, well-perfused. No jaundice. No rashes.   EYES: Sclera anicteric.  ENT: Hearing intact to voice.   NEURO: Alert, oriented, motor and speech grossly nl.  PSYCH:  Normal affect and behavior.  LUNGS: Clear to auscultation bilaterally, no wheezing, rales, or rhonchi  CV: Regular rate and rhythm, no murmurs, clicks, or rubs    STUDIES:     Relevant labs and imaging were reviewed today by me in conjunction with the patient.    Clinical Support on 06/15/2019   Component Date Value Ref Range Status   ??? Hgb A1c - HPLC 06/15/2019 6.3* <5.7 % Final    For patients with diabetes, an A1c less than (<) or equal (=) to 7.0% is recommended for most patients, however the goal may be higher or lower depending on age and/or other medical problems.   For a diagnosis of diabetes, A1c greater than (>) or equal(=) to 6.5% indicates diabetes; values between 5.7% and 6.4% may indicate an increased risk of developing diabetes.   ??? Cholesterol 06/15/2019 182  See Comment mg/dL Final    The significance of total cholesterol depends on the values of LDL, HDL, triglycerides and the clinical context. A patient-provider discussion may be considered.       ??? Cholesterol,LDL,Calc 06/15/2019 94  <100 mg/dL Final    If LDL value falls outside of the designated range AND if  included in any of the following categories, a  patient-provider discussion is recommended.     Statin therapy is recommended for individuals:  1. with clinical atherosclerotic cardiovascular disease     irrespective of LDL levels;  2. with LDL > or = 190 mg/dL;  3. with diabetes, aged 40-75 years, with LDL between 70 and     189 mg/dL;  4. without any of the above but who have LDL between 70 and     189 mg/dL and an estimated 08-MVHQ risk of     atherosclerotic cardiovascular disease > or = 7.5%     (consider statin therapy if estimated 10-year risk > or =     5.0%) (ACC/AHA 2013 Guidelines).   ??? Cholesterol, HDL 06/15/2019 49  >40 mg/dL Final    If HDL cholesterol level falls outside of the designated  range, a patient-provider discussion is recommended   ??? Triglycerides 06/15/2019 195* <150 mg/dL Final    If Triglyceride level falls outside of the designated range,  a patient-provider discussion is recommended.     ??? Non-HDL,Chol,Calc 06/15/2019 133* <130 mg/dL Final    If Non-HDL cholesterol level falls outside of the designated  range, a patient-provider discussion is recommended.   ??? TSH 06/15/2019 0.76  0.3 - 4.7 mcIU/mL Final    TSH is normal, no further Thyroid tests were performed.    If applicable TSH pregnancy reference intervals:   First trimester (10-[redacted] weeks gestation): 0.03- 4.0 mcIU/mL  Second trimester (14-[redacted] weeks gestation): 0.19- 4.0 mcIU/mL     ??? Sodium 06/15/2019 140  135 - 146 mmol/L Final   ??? Potassium 06/15/2019 4.2  3.6 - 5.3 mmol/L Final   ??? Chloride 06/15/2019 100  96 - 106 mmol/L Final   ??? Total CO2 06/15/2019 27  20 - 30 mmol/L Final   ??? Anion Gap 06/15/2019 13  8 - 19 mmol/L Final   ??? Glucose 06/15/2019 117* 65 - 99 mg/dL Final   ??? GFR Estimate for Non-African Ameri* 06/15/2019 57  See GFR Additional  Information mL/min/1.54m2 Final   ??? GFR Estimate for African American 06/15/2019 66  See GFR Additional Information mL/min/1.24m2 Final   ??? GFR Additional Information 06/15/2019 See Comment   Final    GFR >89        Normal  GFR 60 - 89    Normal to mildly decreased  GFR 45 - 59    Mildly to moderately decreased  GFR 30 - 44    Moderately to severely decreased  GFR 15 - 29    Severely decreased  GFR <15        Kidney failure  The CKD-EPI equation was used to estimate GFR and assumes stable creatinine concentrations.  Results are in mL/min/1.73 square meters.   ??? Creatinine 06/15/2019 1.35* 0.60 - 1.30 mg/dL Final   ??? Urea Nitrogen 06/15/2019 28* 7 - 22 mg/dL Final   ??? Calcium 16/05/9603 9.4  8.6 - 10.4 mg/dL Final   ??? Total Protein 06/15/2019 7.1  6.1 - 8.2 g/dL Final   ??? Albumin 54/04/8118 4.1  3.9 - 5.0 g/dL Final   ??? Bilirubin,Total 06/15/2019 0.4  0.1 - 1.2 mg/dL Final   ??? Alkaline Phosphatase 06/15/2019 63  37 - 113 U/L Final   ??? Aspartate Aminotransferase 06/15/2019 19  13 - 47 U/L Final   ??? Alanine Aminotransferase 06/15/2019 15  8 - 64 U/L Final   ??? White Blood Cell Count 06/15/2019 8.74  4.16 - 9.95 x10E3/uL Final   ??? Red Blood Cell Count 06/15/2019 5.37  4.41 - 5.95 x10E6/uL Final   ??? Hemoglobin 06/15/2019 15.1  13.5 - 17.1 g/dL Final   ??? Hematocrit 06/15/2019 46.7  38.5 - 52.0 % Final   ??? Mean Corpuscular Volume 06/15/2019 87.0  79.3 - 98.6 fL Final   ??? Mean Corpuscular Hemoglobin 06/15/2019 28.1  26.4 - 33.4 pg Final   ??? MCH Concentration 06/15/2019 32.3  31.5 - 35.5 g/dL Final   ??? Red Cell Distribution Width-SD 06/15/2019 45.0  36.9 - 48.3 fL Final   ??? Red Cell Distribution Width-CV 06/15/2019 14.1  11.1 - 15.5 % Final   ??? Platelet Count, Auto 06/15/2019 273  143 - 398 x10E3/uL Final   ??? Mean Platelet Volume 06/15/2019 10.4  9.3 - 13.0 fL Final   ??? Nucleated RBC%, automated 06/15/2019 0.0  No Ref. Range % Final    Percent Reference Range Not Reported per accrediting agency   ??? Absolute Nucleated RBC Count 06/15/2019 0.00  0.00 - 0.00 x10E3/uL Final   ??? Neutrophil Abs (Prelim) 06/15/2019 5.57  See Absolute Neut Ct. x10E3/uL Final    This is a preliminary result.  If automated differential see Absolute Neut  Count or if manual differential see Absolute Neut Ct, Manual for final result.   ??? Neutrophil Percent, Auto 06/15/2019 63.8  No Ref. Range % Final    Percent reference range not reported per accrediting agency.     ??? Lymphocyte Percent, Auto 06/15/2019 24.5  No Ref. Range % Final    Percent reference range not reported per accrediting agency.     ??? Monocyte Percent, Auto 06/15/2019 8.6  No Ref. Range % Final    Percent reference range not reported per accrediting agency.     ??? Eosinophil Percent, Auto 06/15/2019 2.5  No Ref. Range % Final    Percent reference range not reported per accrediting agency.     ??? Basophil Percent, Auto 06/15/2019 0.3  No Ref. Range % Final    Percent reference range  not reported per accrediting agency.     ??? Immature Granulocytes% 06/15/2019 0.3  No Reference Range % Final    Percent reference range not reported per accrediting agency.     ??? Absolute Neut Count 06/15/2019 5.57  1.80 - 6.90 x10E3/uL Final   ??? Absolute Lymphocyte Count 06/15/2019 2.14  1.30 - 3.40 x10E3/uL Final   ??? Absolute Mono Count 06/15/2019 0.75  0.20 - 0.80 x10E3/uL Final   ??? Absolute Eos Count 06/15/2019 0.22  0.00 - 0.50 x10E3/uL Final   ??? Absolute Baso Count 06/15/2019 0.03  0.00 - 0.10 x10E3/uL Final   ??? Absolute Immature Gran Count 06/15/2019 0.03  0.00 - 0.04 x10E3/uL Final         ASSESSMENT & PLAN:       Diagnoses and all orders for this visit:    Essential hypertension  -     Basic Metabolic Panel; Future    Hyperlipidemia, unspecified hyperlipidemia type    Prediabetes  -     Hgb A1c; Future    Skin lesions  -     Referral to Dermatology    Colon cancer screening  -     Referral for Colonoscopy Procedure    Prostate cancer screening  -     PSA,Total    recheck labs today  Consider renal referral if creatinine remains elevated   Discussed diet and exercise     Call or return to clinic prn if these symptoms worsen or fail to improve as anticipated.     The above plan was explained and reviewed with the patient.  All questions were answered.     Veronda Prude, MD  Family Medicine  [facility]

## 2019-12-25 ENCOUNTER — Ambulatory Visit: Payer: PRIVATE HEALTH INSURANCE

## 2019-12-25 DIAGNOSIS — R7989 Other specified abnormal findings of blood chemistry: Secondary | ICD-10-CM

## 2019-12-25 DIAGNOSIS — I10 Essential (primary) hypertension: Secondary | ICD-10-CM

## 2019-12-25 LAB — Basic Metabolic Panel
SODIUM: 141 mmol/L (ref 135–146)
SODIUM: 141 mmol/L (ref 135–146)

## 2019-12-25 LAB — PSA,Total: PSA,TOTAL: 1.9 ng/mL (ref 0–4.5)

## 2019-12-25 LAB — Hgb A1c: HGB A1C - HPLC: 6.3 — ABNORMAL HIGH (ref <5.7)

## 2020-01-10 MED ORDER — METOPROLOL TARTRATE 25 MG PO TABS
ORAL_TABLET | 0 refills
Start: 2020-01-10 — End: ?

## 2020-01-11 MED ORDER — METOPROLOL TARTRATE 25 MG PO TABS
ORAL_TABLET | 1 refills | Status: AC
Start: 2020-01-11 — End: ?

## 2020-01-22 ENCOUNTER — Ambulatory Visit: Payer: BLUE CROSS/BLUE SHIELD

## 2020-01-25 ENCOUNTER — Inpatient Hospital Stay: Payer: Commercial Managed Care - Pharmacy Benefit Manager

## 2020-01-25 ENCOUNTER — Telehealth: Payer: PRIVATE HEALTH INSURANCE

## 2020-01-25 DIAGNOSIS — Z01812 Encounter for preprocedural laboratory examination: Secondary | ICD-10-CM

## 2020-01-25 MED ORDER — SUPREP BOWEL PREP KIT 17.5-3.13-1.6 GM/177ML PO SOLN
0 refills | Status: AC
Start: 2020-01-25 — End: ?

## 2020-01-25 NOTE — Telephone Encounter
MPU Procedure Checklist:     [x] COVID Order has been pended and sent to MD performing procedure to sign.     [x] Open access patient- Pharmacy on file has been confirmed and bowel prep medication has been pended.    Prep instructions have been delivered to patient via:  [] Email to __________  [] Fax to ____________  [] Mail to Address on file  [x] Sent via letter on Mychart  [] Preps provided by the office      [] Encounter sent to FCU team if procedure is within the week or if any changes were made to originally scheduled procedure such as an insurance change or a procedure was added.       MAC Script    Patient has been advised of method of anesthesia that will be used in this procedure.  If MAC used, MAC script has been advised.  If IVCS used, pt given option of MAC.     [x] Pt has been offered all options and is requesting to be scheduled with MAC. Patient will pay $200 if authorization is denied.    [] Pt has been scheduled in [place] on sedation day (if insurance applicable).      [] Patient has requested to be scheduled with conscious sedation in one of our community Medical procedure units.

## 2020-01-25 NOTE — Telephone Encounter
Reply by: Preslie Depasquale A. Haliey Romberg  Signed. Thanks, Henley Boettner

## 2020-01-27 ENCOUNTER — Ambulatory Visit: Payer: BLUE CROSS/BLUE SHIELD

## 2020-01-27 DIAGNOSIS — L821 Other seborrheic keratosis: Secondary | ICD-10-CM

## 2020-01-27 DIAGNOSIS — D485 Neoplasm of uncertain behavior of skin: Secondary | ICD-10-CM

## 2020-01-27 DIAGNOSIS — D239 Other benign neoplasm of skin, unspecified: Secondary | ICD-10-CM

## 2020-01-27 DIAGNOSIS — Z85828 Personal history of other malignant neoplasm of skin: Secondary | ICD-10-CM

## 2020-01-27 DIAGNOSIS — L814 Other melanin hyperpigmentation: Secondary | ICD-10-CM

## 2020-01-27 NOTE — Progress Notes
Subjective: Calvin Chen is a 64 y.o. male  who presents to dermatology clinic today as a fu pt for evaluation of concerning areas and a total body skin exam. Today pt is concerned about a spot on the L third finger that has been stable. Would like it removed at some point but not today    History of skin ca: yes, bcc on nose    ROS:  General: No fevers.  Skin: No other lesions that are itching, bleeding, changing, non-healing.     Past history -   Past Medical History:   Diagnosis Date   ??? Hyperlipidemia 2009   ??? Hypertension 2009   ??? Sleep apnea 2014    Use APAP       Objective:   There were no vitals taken for this visit.  Patient is well nourished, 64 y.o. male alert and oriented with appropriate mood and affect. Patient is alert and oriented x3. Exam today focused on the skin via visual inspection and palpation and included the following areas:   ??? Scalp  ??? Face  ??? Eyelids and conjunctivae  ??? Lips, teeth and gums  ??? Neck  ??? Chest  ??? Abdomen  ??? Back  ??? Bilateral upper extremities  ??? Bilateral lower extremities  ??? Digits and nails  Notable findings include:   numerous brown scaly papules on the trunk and extremities c/w seborrheic keratoses  Multiple tan-brown macules and papules c/w benign-appearing nevi, none meeting ABCDE criteria  Numerous light brown macules and patches in a photo-distribution  Firm nodule L dorsal third finger  Scarred papule L shoulder  Scar no e/o recurrence on nose    Assessment:   1. Seborrheic keratosis    2. Lentigo    3. History of nonmelanoma skin cancer    4. Neoplasm of uncertain behavior of skin  - ?? Mucinous cyst vs wart vs adnexal neoplasm vs other  - can remove per pt preference in future    5. Dermatofibroma        Plan:  o The above diagnosis and treatment options were reviewed with the patient.  o We educated the patient today about the benign nature of some of the lesions seen on today's exam.  o We recommended sunscreen and sun-protective clothing on a daily basis.  o The patient was informed of the major side effects associated with the above medication(s) and recommended to read the package inserts. Education was provided on the correct use of the medications and over the counter products, and the need to use these on a regular basis in order to achieve the best results.     RTC in 1 year or sooner    Casimiro Needle E. Margaretha Glassing, MD

## 2020-01-28 ENCOUNTER — Ambulatory Visit: Payer: BLUE CROSS/BLUE SHIELD

## 2020-02-09 MED ORDER — SUPREP BOWEL PREP KIT 17.5-3.13-1.6 GM/177ML PO SOLN
0 refills | 1.00000 days
Start: 2020-02-09 — End: ?

## 2020-02-10 ENCOUNTER — Institutional Professional Consult (permissible substitution): Payer: PRIVATE HEALTH INSURANCE

## 2020-02-10 DIAGNOSIS — Z01812 Encounter for preprocedural laboratory examination: Secondary | ICD-10-CM

## 2020-02-10 MED ORDER — SUPREP BOWEL PREP KIT 17.5-3.13-1.6 GM/177ML PO SOLN
0 refills | Status: AC
Start: 2020-02-10 — End: ?

## 2020-02-11 ENCOUNTER — Ambulatory Visit: Payer: PRIVATE HEALTH INSURANCE

## 2020-02-11 ENCOUNTER — Telehealth: Payer: Commercial Managed Care - Pharmacy Benefit Manager

## 2020-02-11 ENCOUNTER — Inpatient Hospital Stay: Payer: BLUE CROSS/BLUE SHIELD

## 2020-02-11 DIAGNOSIS — R7989 Other specified abnormal findings of blood chemistry: Secondary | ICD-10-CM

## 2020-02-11 DIAGNOSIS — K625 Hemorrhage of anus and rectum: Secondary | ICD-10-CM

## 2020-02-11 DIAGNOSIS — M25551 Pain in right hip: Secondary | ICD-10-CM

## 2020-02-11 LAB — COVID-19 PCR

## 2020-02-11 NOTE — Telephone Encounter
Called pt. Went directly to [employer]. Left msg that he should not be on Xarelto today/tomorrow. As he was open access, I do not know if he was advised of this at time of scheduling.

## 2020-02-11 NOTE — Progress Notes
[place] [facility]     Primary Care Progress Note        Patient:  Calvin Chen  Medical record number:  [id number]  Date of birth:  [birth year]  Date of service:  02/11/2020    Chief Complaint   Patient presents with   ??? Hip Pain     R side x 1 week possibly due to cycling or stretching    ??? Rectal Bleeding     x 1 week       BP 137/82  ~ Pulse 55  ~ Temp 36.3 ???C (97.3 ???F) (Tympanic)  ~ Resp 17  ~ Ht 5' 7'' (1.702 m)  ~ Wt 204 lb 6.4 oz (92.7 kg)  ~ SpO2 97%  ~ BMI 32.01 kg/m???     SUBJECTIVE:     Calvin Chen is a 64 y.o. male who presents for :    1. Right hip pain : began one week ago.  Difficult to lie on her right side  Pain when flexing his hip   No b/b inc  Back pain has improved       2. Rectal bleeding : began 1-1.5 weeks ago   No pain  Noticed blood with bowel movement, also notices blood when passing gas   Bright red blood     Colonoscopy scheduled for tomorrow at 10am   Starting the prep this afternoon    History of rectal bleeding in the past  Current episode is worse  Dx with hemorroids in the past       3. Stress : father needed to move out of his house   Patient was sleeping on his father's couch and helping his father   Father has now moved to facility in [place]       Patient Active Problem List   Diagnosis   ??? Atrial fibrillation (HCC/RAF)   ??? Benign prostatic hyperplasia   ??? Prediabetes   ??? Erectile dysfunction   ??? HTN (hypertension)   ??? Hyperlipidemia   ??? Long term current use of anticoagulant   ??? Obstructive sleep apnea   ??? Obesity   ??? Urinary tract infectious disease   ??? Hypertensive urgency       Medication:  Medications that the patient states to be currently taking   Medication Sig   ??? ATORVASTATIN 20 mg tablet TAKE 1 TABLET DAILY   ??? LISINOPRIL-HYDROCHLOROTHIAZIDE 20-25 mg tablet TAKE 2 TABLETS DAILY   ??? metoprolol tartrate 25 mg tablet TAKE 1 TABLET TWICE A DAY.   ??? rivaroxaban (XARELTO) 20 mg tablet Take 1 tablet (20 mg total) by mouth daily with dinner Please make appointment for follow up.   ??? sodium sulfate-potassium sulfate-magnesium sulfate (SUPREP BOWEL PREP KIT) solution Take as directed by your doctor for colonoscopy.       Allergies:   Allergies   Allergen Reactions   ??? Sulfa Antibiotics Rash     Bactrim  Diffuse maculopapular rash, fever   ??? Naproxen Rash       Social History     Tobacco Use   ??? Smoking status: Never Smoker   ??? Smokeless tobacco: Never Used   Substance Use Topics   ??? Alcohol use: Yes     Alcohol/week: 4.2 oz     Types: 7 Glasses of Wine (5 oz) per week     Comment: 1 glass daily          Family History   Problem Relation Age of Onset   ???  Diabetes Mother    ??? Heart disease Mother    ??? Other (mitral valve disease) Brother    ??? Anesthesia problems Neg Hx    ??? Malignant hyperthermia Neg Hx        Review of Systems: see HPI  No cough  No shortness of breath  No sore throat  No fever or chills   No diarrhea   No muscle pain  No nausea or vomiting   No change in taste or smell      PHYSICAL EXAM:     BP 137/82  ~ Pulse 55  ~ Temp 36.3 ???C (97.3 ???F) (Tympanic)  ~ Resp 17  ~ Ht 5' 7'' (1.702 m)  ~ Wt 204 lb 6.4 oz (92.7 kg)  ~ SpO2 97%  ~ BMI 32.01 kg/m???   GEN: WDWN. NAD.  SKIN: Warm, dry, well-perfused. No jaundice. No rashes.   EYES: Sclera anicteric.  ENT: Hearing intact to voice.   NEURO: Alert, oriented, motor and speech grossly nl.  PSYCH:  Normal affect and behavior.  Bilateral hips: right hip - pain with flexion and internal rotation, decreased rom  Left hip: normal     STUDIES:     Relevant labs and imaging were reviewed today by me in conjunction with the patient.        ASSESSMENT & PLAN:       Diagnoses and all orders for this visit:    Right hip pain  Comments:  with decreased rom, possible djd   refer to sports medicine   check xray today   Orders:  -     Referral to Primary Care, Sports Medicine  -     XR hip ap+lat right w pelvis (3 Views); Future    Rectal bleeding  Comments:  has colonoscopy scheduled for tomorrow  consider colo-rectal surgery referral prn Elevated serum creatinine  Comments:  make appt with nephrology       Offered therapy for life stress, patient wishes to defer at this time     Call or return to clinic prn if these symptoms worsen or fail to improve as anticipated.     The above plan was explained and reviewed with the patient.  All questions were answered.     Veronda Prude, MD  Family Medicine  [facility]

## 2020-02-12 ENCOUNTER — Ambulatory Visit: Payer: BLUE CROSS/BLUE SHIELD

## 2020-02-12 MED ORDER — HYDROCORTISONE (PERIANAL) 2.5 % EX CREA
Freq: Two times a day (BID) | RECTAL | 0 refills | Status: AC
Start: 2020-02-12 — End: ?

## 2020-02-12 MED ADMIN — SODIUM CHLORIDE 0.9 % IV SOLN: @ 17:00:00 | Stop: 2020-02-12 | NDC 00338004904

## 2020-02-12 MED ADMIN — PROPOFOL 200 MG/20ML IV EMUL: @ 17:00:00 | Stop: 2020-02-12 | NDC 63323026929

## 2020-02-12 MED ADMIN — LIDOCAINE HCL (PF) 1 % IJ SOLN: @ 17:00:00 | Stop: 2020-02-12 | NDC 63323049257

## 2020-02-12 NOTE — Op Note
rcvd pt in stable condition, via gurney, VSS, on RA, IVF running tko, no complaints of pain, will continue to monitor, safety and fall precautions maintained; pt ambulated hallway and to restroom, pt urinated. D/C instructions given and explained, pt verbalized understanding.  pt stable for discharge.

## 2020-02-12 NOTE — H&P
UPDATED H&P REQUIREMENT    For Ardyth Harps [facility] and Norval Gable [facility] and [facility]    WHAT IS THE STATUS OF THE PATIENT'S MOST CURRENT HISTORY AND PHYSICAL?   - There is no recent H&P <30 days.  Proceed to H&P Notes section for new H&P.     REFER TO MEDICAL STAFF POLICIES REGARDING PRE-PROCEDURE HISTORY AND PHYSICAL EXAMINATION AND UPDATED H&P REQUIREMENTS BELOW:    Ardyth Harps [facility] and [facility] and [facility] Medical Staff Policy 200 - For Patients Undergoing Procedures Requiring Moderate or Deep Sedation, General Anesthesia or Regional Anesthesia    Contents of a History and Physical Examination (H&P):    The H&P shall consist of chief complaint, history of present illness, allergies and medications, relevant social and family history, past medical history, review of systems and physical examination, and assessment and plan appropriate to the patient's age.    For Patients Undergoing Procedures Requiring Moderate or Deep Sedation, General Anesthesia or Regional Anesthesia:    1. An H&P shall be performed within 24 hours prior to the procedure by a qualified member of the medical staff or designee with appropriate privileges, except as noted in item 2 below.    2. If a complete history and physical was performed within thirty (30) calendar days prior to the patient???s admission to the Medical Center for elective surgery, a member of the medical staff assumes the responsibility for the accuracy of the clinical information and will need to document in the medical record within twenty-four (24) hours of admission and prior to surgery or major invasive procedure, that they either attest that the history and physical has been reviewed and accepted, or document an update of the original history and physical relevant to the patient's current clinical status.    3. Providing an H&P for patients undergoing surgery under local anesthesia is at the discretion of the Attending Physician.     4. When a procedure is performed by a dentist, podiatrist or other practitioner who is not privileged to perform an H&P, the anesthesiologist???s assessment immediately prior to the procedure will constitute the 24 hour re-assessment.The dentist, podiatrist or other practitioner who is not privileged to perform an H&P will document the history and physical relevant to the procedure.    5. If the H&P and the written informed consent for the surgery or procedure are not recorded in the patient's medical record prior to surgery, the operation shall not be performed unless the attending physician states in writing that such a delay could lead to an adverse event or irreversible damage to the patient.    6. The above requirements shall not preclude the rendering of emergency medical or surgical care to a patient in dire circumstances.

## 2020-02-12 NOTE — Procedures
PATIENT NAME:       Calvin Chen, Calvin Chen  DATE OF BIRTH:       1955/09/09  RECORD NUMBER:      [id number]  DATE/TIME OF PROCEDURE:     02/12/2020 / 10:00:00 AM  ENDOSCOPIST:       Francella Solian, MD  REFERRING PHYSICIAN:        FELLOW:           INDICATIONS FOR EXAMINATION:      Screening Colonoscopy.               PROCEDURE PERFORMED:             COLONOSCOPY - screening, average risk, cold biopsy    MEDICATIONS:    MAC Anesthesia    PROCEDURE TECHNIQUE:  Patient's medications, allergies, past medical, surgical, social and family histories were reviewed and updated as appropriate. A discussion of informed consent was had with the patient and/or the patient's family prior to the procedure, including   sedation.  The alternatives, benefits and risks of  the procedure including but not limited to perforation, hemorrhage, infection, adverse drug reaction and aspiration were discussed    Description of Procedure:  After discussion of informed consent, and appropriate level of sedation were attained, the patient was placed in the left lateral position.  The patient was monitored continuously with pulse oximetry, blood pressure monitoring, and direct observations.      After digital rectal examination, the colonoscope [id number] was inserted into the rectum and advanced under direct vision to the level of the terminal ileum.  The quality of the colonic preparation was Excellent.  A careful inspection was made as   the colonoscope was withdrawn.  Findings and interventions are described below.    Total Withdrawl Time: 11 minutes  Total Insertion Time:                              EXTENT OF EXAM:  terminal ileum            INSTRUMENTS:   [id number]  TECHNICAL DIFFICULTY:  No   LIMITATIONS:      None  TOLERANCE:   Good  VISUALIZATION:  Good    FINDINGS:   Normal terminal ileum.  2 mm transverse colon polyp vs nodularity removed with cold forceps polypectomy.  3 mm descending colon polyp removed with cold forceps polypectomy. 3 mm rectal polyp removed with cold forceps polypectomy.  Moderately inflamed internal hemorrhoids.    ESTIMATED BLOOD LOSS:   None     DIAGNOSIS:  Normal terminal ileum.  2 mm transverse colon polyp vs nodularity removed with cold forceps polypectomy.  3 mm descending colon polyp removed with cold forceps polypectomy.  3 mm rectal polyp removed with cold forceps polypectomy.  Moderately inflamed internal hemorrhoids.    RECOMMENDATIONS:  High fiber diet.  Follow-up on biopsy results.  Repeat colonoscopy pending biopsy results.  Consider hydrocortisone cream/suppository.  Minimize time on toilet.  Avoid straining/constipation.  Resume Xarelto tomorrow.            This electronic signature authenticates all electronic and/or handwritten documentation, including orders, generated by the signer during the episode of care contained in this record.  02/12/2020 10:42:50 AM By Francella Solian MD

## 2020-02-12 NOTE — H&P
Indication for Procedure: screening    Level of sedation intended for procedure: moderate, deep, MAC  []  Moderate []  Deep [x]  MAC []  Anesthesia   Prior sedation problems []  Yes [x]  No  If yes explain:  Teeth: [x]  Intact []  Loose []  Missing []  Dentures []  Bridges  [place] Visualized: [x]  Yes []  Partially []  not at all  Neck ROM: [x]   Normal []  Limited  Past Medical History:  Past Medical History:   Diagnosis Date   ??? GERD (gastroesophageal reflux disease)    ??? Hyperlipidemia 2009   ??? Hypertension 2009   ??? Sleep apnea 2014    Use APAP       Past Surgical History:  Past Surgical History:   Procedure Laterality Date   ??? COLONOSCOPY     ??? none          Family History:  Family History   Problem Relation Age of Onset   ??? Diabetes Mother    ??? Heart disease Mother    ??? Other (mitral valve disease) Brother    ??? Anesthesia problems Neg Hx    ??? Malignant hyperthermia Neg Hx         Social History:  Social History     Socioeconomic History   ??? Marital status: Married     Spouse name: Not on file   ??? Number of children: Not on file   ??? Years of education: Not on file   ??? Highest education level: Not on file   Occupational History   ??? 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 Financial resource strain: Not on file   ??? Food insecurity     Worry: Not on file     Inability: Not on file   ??? Transportation needs     Medical: Not on file     Non-medical: Not on file   Tobacco Use   ??? Smoking status: Never Smoker   ??? Smokeless tobacco: Never Used   Substance and Sexual Activity   ??? Alcohol use: Yes     Alcohol/week: 4.2 oz     Types: 7 Glasses of Wine (5 oz) per week     Comment: 1 glass daily   ??? Drug use: No   ??? Sexual activity: Yes     Partners: Female   Lifestyle   ??? Physical activity     Days per week: Not on file     Minutes per session: Not on file   ??? Stress: Not on file   Relationships   ??? Social Wellsite geologist on phone: Not on file     Gets together: Not on file Attends religious service: Not on file     Active member of club or organization: Not on file     Attends meetings of clubs or organizations: Not on file     Relationship status: Not on file   Other Topics Concern   ??? Do you exercise at least a day, 3 or more days a week? Yes   ??? Types of Exercise? (List in Comments) Yes     Comment: Walking, cycling, gym   ??? Do you follow a special diet? No   ??? Vegan? No   ??? Vegetarian? No   ??? Pescatarian? No   ??? Lactose Free? No   ??? Gluten Free? No   ??? Omnivore? Yes   Social History Narrative  Diet: needs work    Exercise: has been increasing his level - walks daily; cycles 1-2 times a week, gym 1-2 times per week     Sleep: 6 hours last night        Allergies:  Allergies   Allergen Reactions   ??? Sulfa Antibiotics Rash     Bactrim  Diffuse maculopapular rash, fever   ??? Naproxen Rash       Current Medications:  No current facility-administered medications for this encounter.      ROS:  Constitutional: The patient denies any fever or chills.  HEENT: no symptoms  Skin: Patient denies bruising, rashes or itching.  Cardiovascular: Patient denies any chest pains.  There is no palpitations.  Respiratory: Patient denies any shortness of breath.  There is no cough or congestion.    Gastrointestinal: as in HPI and chief complaints  Genitourinary: Patient denies any symptoms of frequency urination or burning urination.  Neurological: Patient denies any lightheadedness, dizziness or loss of consciousness.  Psychiatric: Patient denies any symptoms of anxiety, depression or agitation.  Musculoskeletal: Patient denies any ankle swelling.      Physical Exam:    Vitals Signs: There were no vitals taken for this visit.  Constitutional: Patient appears very comfortable.  HENT: Normal exam  HEENT: normal exam  Cardiovascular: Heart sounds are normal.  There no murmurs or gallops.  There is no irregular heartbeat.  Respiratory: Patient is not short of breath.  There is no wheezing or rales. Lungs are clear to auscultation.  Gastrointestinal: Abdomen is soft and nontender.  Bowel sounds are present.   No masses noted.   Genitourinary: Unremarkable examination.  Musculoskeletal: There is no edema, cyanosis or clubbing.    Integumentry: There is no bruising.  No rashes are noted.  Neurological: Patient is awake alert and fully oriented. Psychiatric: Patient is not depressed, apprehensive or agitated.      Assessment:  Calvin Chen is a 64 y.o.male who is brought to the endoscopy suite for the procedure because of the following indications: colon cancer screening        The procedure and its possible complications including but not limited to bleeding and perforation and their treatment including but not limited to blood transfusion and even surgery was once again explained to, and understood by the patient. Patient agrees to proceed with the procedure.    ASA Classification:  3    I have reviewed the history and physical and have determined Calvin Chen to be an appropriate candidate and medically stable to undergo the procedure with planned sedation and analgesia.    Patient cleared for discharge once discharge criteria is met.

## 2020-02-16 LAB — Tissue Exam

## 2020-02-22 ENCOUNTER — Ambulatory Visit: Payer: BLUE CROSS/BLUE SHIELD

## 2020-02-29 ENCOUNTER — Ambulatory Visit: Payer: BLUE CROSS/BLUE SHIELD

## 2020-02-29 DIAGNOSIS — M7061 Trochanteric bursitis, right hip: Secondary | ICD-10-CM

## 2020-02-29 DIAGNOSIS — S76311A Strain of muscle, fascia and tendon of the posterior muscle group at thigh level, right thigh, initial encounter: Secondary | ICD-10-CM

## 2020-02-29 DIAGNOSIS — S76011A Strain of muscle, fascia and tendon of right hip, initial encounter: Secondary | ICD-10-CM

## 2020-02-29 DIAGNOSIS — M461 Sacroiliitis, not elsewhere classified: Secondary | ICD-10-CM

## 2020-02-29 DIAGNOSIS — S76312A Strain of muscle, fascia and tendon of the posterior muscle group at thigh level, left thigh, initial encounter: Secondary | ICD-10-CM

## 2020-02-29 NOTE — Patient Instructions
Start home stretches and exercises   Referral to formal PT at [facility]   Ice massage   Let me know if things are not improving and we can consider steroid injection

## 2020-02-29 NOTE — Progress Notes
SPORTS MEDICINE PROGRESS NOTE    Patient:  Calvin Chen    Medical record number:  [id number]    Date of birth:  March 09, 1956  Date of service:  02/29/2020      Chief complaint:    Chief Complaint   Patient presents with   ??? Hip Pain     Right sided pain radiating to lower back pain 2wk Some pain with ROM and worse with prolonged sitting and standing   ??? Left shoulder and neck pain     x3days       History of present illness:           Calvin Chen is a 64 y.o. male          Who presents with the following history of present illness:      Right hip pain:  Present approx 3 wks   Clicking sensation deep in hip   Cycling on road per his usual mileage 8-12 miles   Worse when mounting bike   No pain w excess sitting     Recent XR which showed transitional lumbosacral vertebrae w degen changes     Past medical history:           Patient Active Problem List   Diagnosis   ??? Atrial fibrillation (HCC/RAF)   ??? Benign prostatic hyperplasia   ??? Prediabetes   ??? Erectile dysfunction   ??? HTN (hypertension)   ??? Hyperlipidemia   ??? Long term current use of anticoagulant   ??? Obstructive sleep apnea   ??? Obesity   ??? Urinary tract infectious disease   ??? Hypertensive urgency            Past Medical History:   Diagnosis Date   ??? GERD (gastroesophageal reflux disease)    ??? Hyperlipidemia 2009   ??? Hypertension 2009   ??? Sleep apnea 2014    Use APAP     Past surgical history:         Past Surgical History:   Procedure Laterality Date   ??? COLONOSCOPY     ??? none         Medications:         Medications that the patient states to be currently taking   Medication Sig   ??? ATORVASTATIN 20 mg tablet TAKE 1 TABLET DAILY   ??? LISINOPRIL-HYDROCHLOROTHIAZIDE 20-25 mg tablet TAKE 2 TABLETS DAILY   ??? metoprolol tartrate 25 mg tablet TAKE 1 TABLET TWICE A DAY.   ??? rivaroxaban (XARELTO) 20 mg tablet Take 1 tablet (20 mg total) by mouth daily with dinner Please make appointment for follow up.       Allergy:         Allergies   Allergen Reactions   ??? Sulfa Antibiotics Rash     Bactrim  Diffuse maculopapular rash, fever   ??? Naproxen Rash       Family history:         Family History   Problem Relation Age of Onset   ??? Diabetes Mother    ??? Heart disease Mother    ??? Other (mitral valve disease) Brother    ??? Anesthesia problems Neg Hx    ??? Malignant hyperthermia Neg Hx        Social history:         Social History     Tobacco Use   ??? Smoking status: Never Smoker   ??? Smokeless tobacco: Never Used   Substance Use Topics   ???  Alcohol use: Yes     Alcohol/week: 4.2 oz     Types: 7 Glasses of Wine (5 oz) per week     Comment: 1 glass daily       ROS:            []  Not Obtainable            (Check normal findings)                                                            (Note positive findings)     [x]  Constitutional:   no weakness, dizziness, wt change     [x]  Eyes:   no itching, tearing, blurred vision     [x]  ENMT:   no runny nose, sneezing, hoarseness     [x]  Respiratory:   no sob, cough, wheeze     [x]  Cardiovascular:    no chest pain, palpitations        [x]  Gastrointestinal:    no nausea, vomiting, diarrhea     [x]  Genitourinary:    no frequency, dysuria, nocturea     [x]  Skin:    no rash, itching     []  Musculoskeletal:    no joint pain, myalgia, tendinitis right hip pain     [x]  Neurological:    no headache, tremor, incoordination       []  Endocrine:  no hot flashes, diabetes, thyroid disorder     []  Heme/lymphatic:    no swollen glands, bruising     []  Allergic/immunologic:    no allergy or autoimmunity     []  Psychiatric:    no anxiety or depression        PE:         BP 142/68  ~ Pulse 60  ~ Temp 36.4 ???C (97.5 ???F) (Temporal)  ~ Resp 17  ~ Ht 5' 7'' (1.702 m)  ~ SpO2 98%  ~ BMI 30.38 kg/m???           Constitutional:              Appearance:   Well nourished, well developed, in no acute distress         Respiratory:  Non labored breathing         Musculoskeletal:    Back exam:  - Inspection: Normal posture and gait    - Palpation: Non ttp spinous processes, +tightness and mild ttp right sided lumbar paraspinous muscles, mild ttp right SI joint   - Active ROM: Decreased ROM back flexion dt hamstring tightness. Full ROM extension   - Special Tests: Negative straightleg raise and Slump tests; neg Stork test     Bilateral hip exam:   - Inspection: Normal gait    - Palpation: +tightness hip flexor, non ttp ASIS, mild-mod ttp  Posterior aspect greater trochanter, gluteal muscles, hamstring origin and muscle belly   - Active ROM: Full ROM hip flexion, internal rotation, external rotation   - Strength: 5/5 hip flexion, abduction    - Special Tests: Negative FADIR. Mild pot FABER, pain at greater troch elicited with resisted external rotation          Skin and subcutaneous:  No significant lesions seen         Psychiatric:   Mood normal, affect appropriate  Imaging:   XR HIP AP LAT RIGHT W AP PELVIS 3V : 02/11/2020 2:03 PM  ???  COMPARISON: None.  ???  INDICATION: right hip pain and decreased rom   ???  FINDINGS: The hip joint spaces on both sides are normal. There is no significant spur formation. There are no fractures. There is no chondrocalcinosis.  ???  There are calcifications in the prostate. There is transitional lumbosacral anatomy, with degenerative disease at the pseudoarthrosis on the right. The sacroiliac joints are normal.  ???  ???  IMPRESSION:  1. No significant hip arthritis.  2. Transitional lumbosacral anatomy, with degenerative disease on the right.    Assessment and Plan:        1. Trochanteric bursitis of right hip  2. Sacroiliitis, not elsewhere classified (HCC/RAF)  3. Strain of right hamstring muscle, initial encounter  4. Hamstring strain, left, initial encounter  5. Muscle strain of right gluteal region, initial encounter  Advised focused PT and rec ensure bike seat/handle bars correctly adjusted   Provided home exercises as well   Advised consider csi for troch bursitis if pain worsens or limits ability to attend PT   - Referral to [facility], Physical Therapy Orders Placed This Encounter   ??? Referral to [facility], Physical Therapy              Patient Instructions   Start home stretches and exercises   Referral to formal PT at [facility]   Ice massage   Let me know if things are not improving and we can consider steroid injection           The above recommendation were discussed with the patient.  The patient has all questions answered satisfactorily and is in agreement with this recommended plan of care.    Author:       Laverda Sorenson. Sherlon Handing  1:07 PM

## 2020-03-15 ENCOUNTER — Ambulatory Visit: Payer: Commercial Managed Care - Pharmacy Benefit Manager

## 2020-03-16 MED ORDER — ATORVASTATIN CALCIUM 20 MG PO TABS
ORAL_TABLET | 1 refills | Status: AC
Start: 2020-03-16 — End: ?

## 2020-03-20 ENCOUNTER — Ambulatory Visit: Payer: BLUE CROSS/BLUE SHIELD

## 2020-03-21 DIAGNOSIS — F4329 Adjustment disorder with other symptoms: Secondary | ICD-10-CM

## 2020-03-21 NOTE — Telephone Encounter
Referral done, please contact patient when ready.

## 2020-04-07 ENCOUNTER — Ambulatory Visit: Payer: BLUE CROSS/BLUE SHIELD

## 2020-04-07 DIAGNOSIS — N1832 Stage 3b chronic kidney disease: Secondary | ICD-10-CM

## 2020-04-12 DIAGNOSIS — I10 Essential (primary) hypertension: Secondary | ICD-10-CM

## 2020-04-12 DIAGNOSIS — N400 Enlarged prostate without lower urinary tract symptoms: Secondary | ICD-10-CM

## 2020-04-12 DIAGNOSIS — N528 Other male erectile dysfunction: Secondary | ICD-10-CM

## 2020-04-12 NOTE — Consults
TITLE:  NEPHROLOGY CONSULTATION    DATE OF SERVICE:  04/12/2020    REFERRING PHYSICIAN:  __________       REASON FOR CONSULTATION:  CKD stage 3B.    CHIEF COMPLAINT:  CKD stage 3B.    HISTORY OF PRESENT ILLNESS:  Patient is a 64 year old gentleman with prediabetes, hypertension, being seen for evaluation of CKD. Denies any lower urinary tract symptoms. No flank pain. No peripheral edema. No active chest pain with exertion. He also has obstructive sleep apnea, history of hypertensive urgency.    PAST MEDICAL HISTORY:  Hypertension, prediabetes, BPH, atrial fib, obesity, OSA.    ALLERGIES:  Sulfa antibiotics and naproxen.    MEDICATIONS:  Include lisinopril, hydrochlorothiazide, metoprolol, Xarelto, atorvastatin.    FAMILY HISTORY:  Significant for diabetes. No CKD.    SOCIAL HISTORY:  Nonsmoker.Marland Kitchen    REVIEW OF SYSTEMS:  Fourteen systems reviewed and were negative, other than as stated positive in HPI.    PHYSICAL EXAM:  Blood pressure 133/81, pulse is 65, respiratory rate 16, saturating 97% on room air. He weighs 210 pounds. General: No acute distress. Eyes: Anicteric. Extraocular muscles are intact. Ears, Nose, Mouth, Throat: Oropharynx clear. Moist mucous membranes. Neck: Supple. Cardiovascular: Normal S1, S2. No murmurs, rubs, or gallops. Lungs: Clear to auscultation. Abdomen: Soft, nontender. No flank pain. Skin: Warm and dry. Neuro: Cranial nerves 2-12 intact. Psych: Normal insight and judgment.    LABORATORY VALUES:  Serum creatinine 1.4, GFR 63. Hemoglobin A1c 6.3. ALT slightly high at 85. Urinalysis is normal.    IMPRESSION AND PLAN:  64 year old gentleman with the following issues:  1. Chronic kidney disease stage 3IA, possibly due to hypertension. Seems stable. Will need serum PTH and phosphorus checked for followup.  2. Increase in ALT, should be rechecked in a couple months. He can follow up with Dr. Darnelle Catalan for this.  3. Hypertension, moderately controlled, continue current meds.  4. Obstructive sleep apnea, on CPAP.  5. History of prediabetes, hemoglobin A1c reasonably controlled at this time, no new medications recommended.   I will follow up on the ultrasound. Patient will follow up with Dr. Darnelle Catalan for a recheck of his chemistries in the next couple months. He can follow up with me in 6-12 months for repeat of his parathyroid hormone level, serum phos, vitamin D, and chemistry panel.      Jana Hakim, MD [phone])        RK/MODL CONF#: [id number]  D: 04/12/2020 11:08:05 T: 04/12/2020 11:26:48 DOCUMENT: [id number]

## 2020-04-13 MED ORDER — LISINOPRIL-HYDROCHLOROTHIAZIDE 20-25 MG PO TABS
ORAL_TABLET | 1 refills
Start: 2020-04-13 — End: ?

## 2020-04-13 MED ORDER — RIVAROXABAN 20 MG PO TABS
20 mg | ORAL_TABLET | Freq: Every day | ORAL | 0 refills | 30.00000 days
Start: 2020-04-13 — End: ?

## 2020-04-15 MED ORDER — LISINOPRIL-HYDROCHLOROTHIAZIDE 20-25 MG PO TABS
ORAL_TABLET | 1 refills | Status: AC
Start: 2020-04-15 — End: ?

## 2020-04-15 MED ORDER — RIVAROXABAN 20 MG PO TABS
20 mg | ORAL_TABLET | Freq: Every day | ORAL | 0 refills | Status: AC
Start: 2020-04-15 — End: ?

## 2020-05-12 ENCOUNTER — Ambulatory Visit: Payer: BLUE CROSS/BLUE SHIELD

## 2020-05-15 NOTE — Telephone Encounter
Please request records from [facility] and schedule pt for a follow up visit with me. Thanks

## 2020-05-15 NOTE — Telephone Encounter
Patient scheduled 9/27 @ 2:30pm  [facility] notes requested, fax confirmation 9/26

## 2020-05-16 ENCOUNTER — Ambulatory Visit: Payer: BLUE CROSS/BLUE SHIELD

## 2020-05-16 DIAGNOSIS — M461 Sacroiliitis, not elsewhere classified: Secondary | ICD-10-CM

## 2020-05-16 DIAGNOSIS — R294 Clicking hip: Secondary | ICD-10-CM

## 2020-05-16 DIAGNOSIS — M25851 Other specified joint disorders, right hip: Secondary | ICD-10-CM

## 2020-05-16 NOTE — Patient Instructions
Order for MRI/arthrogram to evaluate possible labral injury

## 2020-05-16 NOTE — Progress Notes
PROGRESS NOTE    Patient:  Calvin Chen    Medical record number:  [id number]    Date of birth:  10/08/1955  Date of service:  05/16/2020      Chief complaint:    Chief Complaint   Patient presents with   ??? Referrals     MRI right hip       History of present illness:           Calvin Chen is a 64 y.o. male          Who presents with the following history of present illness:      Here for fu right hip pain   Pain persisting and worsening despite PT x 8 wks   More frequent pain and more intense   Worse w standing, stairs, sitting and laying on his left side   Pain deep in groin     Not taking any pain meds     Attending PT sessions at [facility]     Past medical history:           Patient Active Problem List   Diagnosis   ??? Atrial fibrillation (HCC/RAF)   ??? Benign prostatic hyperplasia   ??? Prediabetes   ??? Erectile dysfunction   ??? HTN (hypertension)   ??? Hyperlipidemia   ??? Long term current use of anticoagulant   ??? Obstructive sleep apnea   ??? Obesity   ??? Urinary tract infectious disease   ??? Hypertensive urgency            Past Medical History:   Diagnosis Date   ??? GERD (gastroesophageal reflux disease)    ??? Hyperlipidemia 2009   ??? Hypertension 2009   ??? Sleep apnea 2014    Use APAP     Past surgical history:         Past Surgical History:   Procedure Laterality Date   ??? COLONOSCOPY     ??? none         Medications:         Medications that the patient states to be currently taking   Medication Sig   ??? atorvastatin 20 mg tablet TAKE 1 TABLET DAILY.   ??? LISINOPRIL-HYDROCHLOROTHIAZIDE 20-25 mg tablet TAKE 2 TABLETS DAILY   ??? metoprolol tartrate 25 mg tablet TAKE 1 TABLET TWICE A DAY.   ??? rivaroxaban (XARELTO) 20 mg tablet Take 1 tablet (20 mg total) by mouth daily with dinner Please make appointment for follow up.       Allergy:         Allergies   Allergen Reactions   ??? Sulfa Antibiotics Rash     Bactrim  Diffuse maculopapular rash, fever   ??? Naproxen Rash       Family history:         Family History   Problem Relation Age of Onset   ??? Diabetes Mother    ??? Heart disease Mother    ??? Other (mitral valve disease) Brother    ??? Anesthesia problems Neg Hx    ??? Malignant hyperthermia Neg Hx        Social history:         Social History     Tobacco Use   ??? Smoking status: Never Smoker   ??? Smokeless tobacco: Never Used   Substance Use Topics   ??? Alcohol use: Yes     Alcohol/week: 4.2 oz     Types: 7 Glasses of Wine (5 oz) per  week     Comment: 1 glass daily       ROS:            []  Not Obtainable            (Check normal findings)                                                            (Note positive findings)     [x]  Constitutional:   no weakness, dizziness, wt change     [x]  Eyes:   no itching, tearing, blurred vision     [x]  ENMT:   no runny nose, sneezing, hoarseness     [x]  Respiratory:   no sob, cough, wheeze     [x]  Cardiovascular:    no chest pain, palpitations        [x]  Gastrointestinal:    no nausea, vomiting, diarrhea     [x]  Genitourinary:    no frequency, dysuria, nocturea     [x]  Skin:    no rash, itching     []  Musculoskeletal:    no joint pain, myalgia, tendinitis right hip pain     [x]  Neurological:    no headache, tremor, incoordination       []  Endocrine:  no hot flashes, diabetes, thyroid disorder     []  Heme/lymphatic:    no swollen glands, bruising     []  Allergic/immunologic:    no allergy or autoimmunity     []  Psychiatric:    no anxiety or depression        PE:         BP 157/93  ~ Pulse 60  ~ Temp 36 ???C (96.8 ???F) (Tympanic)  ~ Wt 207 lb (93.9 kg)  ~ BMI 32.42 kg/m???           Constitutional:              Appearance:   Well nourished, well developed, in no acute distress         Eyes, Ears, Nose and Throat:  PERRLA, conjuncativae clear.                    Respiratory:  non labored breathing         Musculoskeletal:    Right hip exam:   - Inspection: Normal gait    - Palpation: Mod ttp right SI and greater trochanter   - Active ROM: Full ROM hip flexion, internal rotation. Decreased ROM external rotation   - Special Tests: Pos FADIR and FABER, log roll test          Skin and subcutaneous:  No significant lesions seen         Psychiatric:   Mood normal, affect appropriate    Assessment and Plan:     1. Right hip impingement syndrome  2. Sacroiliitis, not elsewhere classified (HCC/RAF)  3. Clicking of right hip  Ongoing symptoms despite formal PT   Records request from [facility]   Will further eval poss labral injury w MR/A   - [place] hip inj for CT or MRI arthrogram right; Future  - MR hip arthrogram w intra-articular contrast right; Future              Orders Placed This Encounter   ??? [place]  hip inj for CT or MRI arthrogram right   ??? MR hip arthrogram w intra-articular contrast right              Patient Instructions   Order for MRI/arthrogram to evaluate possible labral injury           The above recommendation were discussed with the patient.  The patient has all questions answered satisfactorily and is in agreement with this recommended plan of care.        Author:       Laverda Sorenson. Sherlon Handing  3:08 PM

## 2020-05-17 ENCOUNTER — Inpatient Hospital Stay: Payer: PRIVATE HEALTH INSURANCE

## 2020-05-17 ENCOUNTER — Inpatient Hospital Stay: Payer: BLUE CROSS/BLUE SHIELD

## 2020-05-17 DIAGNOSIS — R294 Clicking hip: Secondary | ICD-10-CM

## 2020-05-17 DIAGNOSIS — M461 Sacroiliitis, not elsewhere classified: Secondary | ICD-10-CM

## 2020-05-17 DIAGNOSIS — M25851 Other specified joint disorders, right hip: Secondary | ICD-10-CM

## 2020-05-17 MED ADMIN — IOTHALAMATE MEGLUMINE 60 % IJ SOLN: 50 mL | INTRA_ARTICULAR | @ 17:00:00 | Stop: 2020-05-17 | NDC 00019095305

## 2020-05-17 MED ADMIN — ROPIVACAINE HCL 5 MG/ML IJ SOLN: 150 mg | INTRA_ARTICULAR | @ 17:00:00 | Stop: 2020-05-17 | NDC 70069006401

## 2020-05-17 MED ADMIN — GADOBUTROL 1 MMOL/ML IV SOLN: 7.5 mL | INTRA_ARTICULAR | @ 17:00:00 | Stop: 2020-05-17 | NDC 50419032511

## 2020-05-17 MED ADMIN — SODIUM CHLORIDE 0.9 % (PF) SOLN: 20 mL | INTRA_ARTICULAR | @ 17:00:00 | Stop: 2020-05-17 | NDC 00409488850

## 2020-05-17 MED ADMIN — LIDOCAINE HCL (PF) 1 % IJ SOLN: 5 mL | SUBCUTANEOUS | @ 17:00:00 | Stop: 2020-05-17 | NDC 63323049257

## 2020-05-21 ENCOUNTER — Telehealth: Payer: PRIVATE HEALTH INSURANCE

## 2020-05-21 NOTE — Telephone Encounter
Fax was sent to [facility] plan of care on 05/18/2020    Fax confirmation received 05/18/2020 @1 :20PM

## 2020-11-19 ENCOUNTER — Ambulatory Visit: Payer: BLUE CROSS/BLUE SHIELD

## 2020-11-19 ENCOUNTER — Inpatient Hospital Stay
Admit: 2020-11-19 | Discharge: 2020-11-20 | Disposition: A | Discharge status: Home / Self Care | Payer: PRIVATE HEALTH INSURANCE | Source: Home / Self Care | Attending: Emergency Medical Services

## 2020-11-19 DIAGNOSIS — R140 Abdominal distension (gaseous): Principal | ICD-10-CM

## 2020-11-19 DIAGNOSIS — R1013 Epigastric pain: Secondary | ICD-10-CM

## 2020-11-19 DIAGNOSIS — K5900 Constipation, unspecified: Secondary | ICD-10-CM

## 2020-11-19 LAB — CREATININE: CREATININE: 1.13 mg/dL (ref 0.60–1.30)

## 2020-11-19 LAB — Glucose, Whole Blood: GLUCOSE, WHOLE BLOOD: 139 mg/dL — ABNORMAL HIGH (ref 65–99)

## 2020-11-19 LAB — Hepatic Funct Panel: TOTAL PROTEIN: 7.3 g/dL (ref 6.1–8.2)

## 2020-11-19 LAB — CBC: HEMATOCRIT: 43.9 (ref 38.5–52.0)

## 2020-11-19 LAB — Lipase: LIPASE: 22 U/L (ref 13–69)

## 2020-11-19 LAB — Electrolyte Panel: POTASSIUM: 3.5 mmol/L — ABNORMAL LOW (ref 3.6–5.3)

## 2020-11-19 LAB — Troponin I: TROPONIN I: <0.04 ng/mL (ref <0.04)

## 2020-11-19 MED ADMIN — ALUM & MAG HYDROXIDE-SIMETH 400-400-40 MG/5ML PO SUSP: 30 mL | ORAL | @ 23:00:00 | Stop: 2020-11-19 | NDC 00121176230

## 2020-11-19 MED ADMIN — LIDOCAINE VISCOUS HCL 2 % MT SOLN: 5 mL | ORAL | @ 23:00:00 | Stop: 2020-11-19 | NDC 17856600202

## 2020-11-19 NOTE — ED Provider Notes
[facility]  Emergency Department Service Report    Triage     Calvin Chen, a 65 y.o. male, presents with Abdominal Pain (epigastric pain, unprovoked, 6/10, sharp, x 1 hr, ''felt faint'' earlier, now resolved, now just feels bloated)    Arrived on 11/19/2020 at 3:07 PM   Arrived by Car [5]    ED Triage Vitals   Temp Temp Source BP Pulse Resp SpO2 O2 Device Pain Score Weight   11/19/20 1512 11/19/20 1512 -- -- 11/19/20 1512 -- -- -- 11/19/20 1515   36.3 ???C (97.4 ???F) Oral   16    94.3 kg (208 lb)       Allergies   Allergen Reactions   ??? Sulfa Antibiotics Rash     Bactrim  Diffuse maculopapular rash, fever   ??? Naproxen Rash        Initial Physician Contact     Medical Screening Exam Initiated     Date/Time Event User Comments    11/19/20 1535 MSE Initiated Cathey Endow [place]       Medical Screening Exam - MD Comments    Date and Time MSE MD Comment User   11/19/20 1527 hx AF on AC, HTN, preDM, HLD, presenting with c/o epigastric pain x1hour. C/o bloating/gas x few days, then starting an hour ago, started having sharp/severe pain to epigastric area and feeling worsening gas pains. took miralax, gasX. Pain not exertional. thinks he had similar pain last year- did not see doctor for it. ate some old canteloupe this morning before pain got worse. No nausea/vomiting/diaphoresis. Has some SOB when pain was bad earlier. Yesterday had some left sided chest pain that he thought was gas related. last BM 2 hours ago. Small amount BRBPR yesterday. No urinary symptoms. on exam well appearing, breathing comf on RA, abd soft, nontender. Plan for labs, trop,CXR. EKG sinus with TWI in V5-V6, II, III that are similar from prior ekg. MRB          Comprehensive Exam Initiated  Contact Date: 11/19/20  Contact Time: 1559    History   Calvin Chen is a 65 y.o. male with history of hypertension, diabetes, hyperlipidemia, presenting with constipation, abdominal distension, epigastric pain.  Patient endorsing abdominal distension and constipation for the last several days.  No fevers or chills.  No weight loss or night sweats.  No history of abdominal surgeries.  Yesterday patient had 10 minute moment of left-sided chest discomfort which has resolved.  Today patient is only complaining of epigastric tenderness and distension.  Symptoms minimally improved with MiraLax and Bean-o.     Past Medical History:   Diagnosis Date   ??? Atrial fibrillation (HCC/RAF)    ??? GERD (gastroesophageal reflux disease)    ??? Hyperlipidemia 2009   ??? Hypertension 2009   ??? Sleep apnea 2014    Use APAP        Past Surgical History:   Procedure Laterality Date   ??? COLONOSCOPY     ??? none          Past Family History   family history includes Diabetes in his mother; Heart disease in his mother; mitral valve disease in his brother.     Past Social History   he reports that he has never smoked. He has never used smokeless tobacco. He reports current alcohol use of about 4.2 oz of alcohol per week. He reports being sexually active and has had partner(s) who are male. He reports that he does  not use drugs.     Review of Systems   Constitutional: Positive for activity change.   Gastrointestinal: Positive for abdominal distention, abdominal pain and constipation.   All other systems reviewed and are negative.      Physical Exam   Vital Signs and nursing note reviewed     GENERAL: AxOx4, generally well-appearing, no acute distress.  HEENT: Atraumatic, normocephalic, vision grossly normal.  NECK: Full range of motion without stiffness or pain.   HEART: Normal peripheral perfusion.  LUNGS: No signs of respiratory distress.   ABDOMEN: Soft, tender in epigastrium, distended without rebound or guarding.  No right lower quadrant.  BACK: No CVAT, no obvious deformity.  EXTREMITIES: No swelling or deformities.   NEUROLOGICAL: Grossly non focal. Able to ambulate with normal gait.  SKIN: Warm and dry without rashes.  PSYCH: Answers questions appropriately. Normal mood and affect.       Medical Decision Making   Calvin Chen is a 65 y.o. male with history of hypertension, diabetes, hyperlipidemia, presenting with constipation, abdominal distension, epigastric pain.  Patient is afebrile.  Exam: Soft, tender in epigastrium, distended abdomen without rebound or guarding.  No right lower quadrant.    Right upper quadrant ultrasound did not reveal pericholecystic fluid collection, gallbladder wall thickening, or evidence of stones.  No sonographic Murphy sign.    EKG showed normal sinus rhythm with T-wave inversions in V5 and V6, which are old.    History and physical is most consistent with constipation and gas.  However will perform ACS workup and check lipase and LFTs to assess for atypical chest pain or pancreatitis.    Chart Review   Previous medical records requested.  Pertinent items reviewed.    ED Course      Laboratory Results     Labs Reviewed   ELECTROLYTE PANEL - Abnormal; Notable for the following components:       Result Value    Potassium 3.5 (*)     All other components within normal limits   GLUCOSE - Abnormal; Notable for the following components:    Glucose 139 (*)     All other components within normal limits   CBC - Abnormal; Notable for the following components:    White Blood Cell Count 15.08 (*)     All other components within normal limits   HEPATIC FUNCT PANEL - Abnormal; Notable for the following components:    Bilirubin,Conjugated 0.4 (*)     Aspartate Aminotransferase 97 (*)     All other components within normal limits   TROPONIN I - Normal   LIPASE - Normal   CREATININE,WHOLE BLOOD   RAINBOW DRAW TO LABORATORY    Narrative:     The following orders were created for panel order Rainbow Draw to Laboratory.  Procedure                               Abnormality         Status                     ---------                               -----------         ------  Extra Red Top (Plastic)[[id number]]                          Final result Extra Light Blue [id number]                             Final result               Extra Gold [id number]                                   Final result                 Please view results for these tests on the individual orders.   EXTRA RED TOP (PLASTIC)   EXTRA LIGHT BLUE TOP   EXTRA GOLD TOP       Imaging Results     XR chest ap portable (1 view)   Final Result by Baltazar Najjar, MD (04/02 1727)   IMPRESSION:        Normal cardiomediastinal silhouette.   Normal lung volumes. No consolidation. Mild pulmonary vascular congestion.   No pleural effusions. No pneumothorax.    No acute osseous abnormalities.      I, Baltazar Najjar, M.D., have reviewed this radiological study personally and I am in full agreement with the findings of the report presented here.      Dictated by: Elana Alm   11/19/2020 5:01 PM      Signed by: Baltazar Najjar   11/19/2020 5:27 PM      POCUS ED LIMITED ULTRASOUND    by Corky Mull., MD (04/02 1615)          Consults     Progress Notes / Reassessments     ED Course as of 11/19/20 1742   Sat Nov 19, 2020   1741 Patient feeling well. Tolerated PO.  [CR]   1741 I discussed the diagnosis and treatment plan with the patient. I provided strict return precautions. I answered all of the patient's questions, and they are amenable to the treatment plan. Patient is safe to be discharged to home.     [CR]      ED Course User Index  [CR] Corky Mull., MD         ED Procedure Notes     Any ED procedures performed are documented on separate ED procedure notes.    Clinical Impression     1. Abdominal distension    2. Abdominal pain, acute, epigastric    3. Constipation, unspecified constipation type        Disposition and Follow-up   Disposition: Discharge [1]       No future appointments.    Follow up with:  Jaynee Eagles., MD  [address]  [address]  [place] [address]  [phone]    Schedule an appointment as soon as possible for a visit         Return precautions are specified on After Visit Summary.    New Prescriptions    No medications on file       Orders Placed This Encounter   ??? XR chest ap portable (1 view)   ??? POCUS ED LIMITED ULTRASOUND   ??? Electrolyte Panel   ??? Creatinine,whole blood   ??? Glucose   ??? CBC without differential   ???  Troponin   ??? Hepatic Funct Panel   ??? Lipase   ??? Rainbow Draw to Laboratory   ??? Extra [employer] (Plastic)   ??? Extra Light Blue Top   ??? Extra Gold Top   ??? ECG, 12 lead   ??? ED 12 LEAD ECG    ??? AND Linked Order Group    ??? aluminum-magnesium hydroxide-simethicone 400-400-40 mg/5 mL susp 30 mL    ??? lidocaine viscous 2% oral soln 5 mL       Resident Signature            Corky Mull., MD  Resident  11/19/20 1742    Attestation     ATTENDING NOTE    I was present with the resident during the key/critical portions of this service.  I have discussed the management with the resident, have reviewed the resident note and agree with the documented findings and plan of care.        Maysa Lynn Oman, MD  11/20/2020 4:55 PM           Oman, Prabhjot Piscitello V., MD  11/20/20 1655

## 2020-11-20 LAB — Extra Gold Top

## 2020-11-20 LAB — Extra Red Top (Plastic)

## 2020-11-20 LAB — Extra Light Blue Top

## 2020-11-20 NOTE — ED Notes
Cleared for discharge by Richards,MD . Home care, follow up with PMD, and return precautions were discussed with the patient , verbalized understanding. VSS. IV removed prior to discharge. Discharged home ambulatory with steady gait and in good condition accompanied by his wife.

## 2023-01-02 DIAGNOSIS — D1771 Benign lipomatous neoplasm of kidney: Secondary | ICD-10-CM

## 2023-10-24 ENCOUNTER — Ambulatory Visit: Payer: BLUE CROSS/BLUE SHIELD

## 2023-10-24 DIAGNOSIS — E1159 Type 2 diabetes mellitus with other circulatory complications: Secondary | ICD-10-CM

## 2023-10-24 DIAGNOSIS — L989 Disorder of the skin and subcutaneous tissue, unspecified: Secondary | ICD-10-CM

## 2023-10-24 DIAGNOSIS — R350 Frequency of micturition: Secondary | ICD-10-CM

## 2023-10-24 DIAGNOSIS — N400 Enlarged prostate without lower urinary tract symptoms: Secondary | ICD-10-CM

## 2023-10-24 DIAGNOSIS — Z0000 Encounter for general adult medical examination without abnormal findings: Secondary | ICD-10-CM

## 2023-10-24 DIAGNOSIS — E119 Type 2 diabetes mellitus without complications: Secondary | ICD-10-CM

## 2023-10-24 DIAGNOSIS — Z23 Encounter for immunization: Secondary | ICD-10-CM

## 2023-10-24 DIAGNOSIS — K649 Unspecified hemorrhoids: Secondary | ICD-10-CM

## 2023-10-24 DIAGNOSIS — I480 Paroxysmal atrial fibrillation: Secondary | ICD-10-CM

## 2023-10-24 DIAGNOSIS — I152 Hypertension secondary to endocrine disorders: Secondary | ICD-10-CM

## 2023-10-24 DIAGNOSIS — E669 Obesity, unspecified: Secondary | ICD-10-CM

## 2023-10-24 DIAGNOSIS — D171 Benign lipomatous neoplasm of skin and subcutaneous tissue of trunk: Secondary | ICD-10-CM

## 2023-10-24 DIAGNOSIS — E66811 Class 1 obesity with serious comorbidity and body mass index (BMI) of 33.0 to 33.9 in adult, unspecified obesity type: Secondary | ICD-10-CM

## 2023-10-24 DIAGNOSIS — Z6833 Body mass index (BMI) 33.0-33.9, adult: Secondary | ICD-10-CM

## 2023-10-24 DIAGNOSIS — E785 Hyperlipidemia, unspecified: Secondary | ICD-10-CM

## 2023-10-24 DIAGNOSIS — E1169 Type 2 diabetes mellitus with other specified complication: Secondary | ICD-10-CM

## 2023-10-24 NOTE — H&P
 WELLNESS VISIT        PATIENT: Calvin Chen  MRN: [id number]  DOB: 07/22/1956  DATE OF SERVICE: 10/24/2023    PRIMARY CARE PROVIDER: Veronda Prude, MD    Subjective:      Calvin Chen is a 68 y.o. male here for his annual wellness visit. Reestablishing care with [facility]   [facility]. Zena Amos [facility]     BP 155/94  ~ Pulse 87  ~ Temp 36.8 ?C (98.2 ?F) (Tympanic)  ~ Wt 215 lb (97.5 kg)  ~ BMI 33.67 kg/m?     PHQ-9 Total Score:      01/20/2018     2:30 PM 10/24/2023     5:00 PM   PHQ-9 Total Score   Total Score 0 0        GAD-7 Total Score:      10/24/2023     5:04 PM   GAD-7 Total Score   Total Score 0         Issues addressed today:    1. Left eye pain : when he scrunches his eyelid   Noticing more eye strain   Needs ophthalmology referral     2. Type 2 dm : taking rybelsus 3mg  and metformin 500mg  bid   Does not want injections   Started rybelsus 3 weeks ago   Some decreased appetite over the past year - before starting rybelsus     [facility] chart:    HBA1C 7.8 (A) 08/23/2023   HBA1C 8.5 (H) 06/20/2023   HBA1C 6.9 (H) 04/12/2022   HBA1C 7.4 (A) 01/11/2022       3. Hypertension : taking lisinopril/hct 20/25mg  2 tablets daily and metoprolol 25mg  bid     4.  Dyslipidemia: taking atorvastatin 20mg  daily     5.  Atrial fibrillation:   Needs new cardiology referral  Taking xarelto 20mg  daily     6.  Sleep apnea:   Requesting portable cpap for 2 week trip to [place]    7. Travel to [place] for 2 weeks  Leaving on 01/18/24    8.  Hemorrhoids  Needs colorectal referral    9.  Skin lesions  Needs derm referral     Medications that the patient states to be currently taking   Medication Sig    LISINOPRIL-HYDROCHLOROTHIAZIDE 20-25 mg tablet TAKE 2 TABLETS DAILY    metFORMIN 500 mg tablet Take 1 tablet (500 mg total) by mouth two (2) times daily.    rivaroxaban (XARELTO) 20 mg tablet Take 1 tablet (20 mg total) by mouth daily with dinner Please make appointment for follow up.    RYBELSUS 3 MG TABS     [DISCONTINUED] atorvastatin 20 mg tablet TAKE 1 TABLET DAILY.    [DISCONTINUED] metoprolol tartrate 25 mg tablet TAKE 1 TABLET TWICE A DAY.     Past Medical History:   Diagnosis Date    Atrial fibrillation (HCC/RAF)     Chicken pox 1965    Diabetes mellitus (HCC/RAF) 10/24/2023    GERD (gastroesophageal reflux disease)     Hyperlipidemia 2009    Hypertension 2009    Kidney stone 2018    Seasonal allergies 1969    Skin cancer 2010    Sleep apnea 2014    Use APAP     Past Surgical History:   Procedure Laterality Date    CARDIAC ELECTROPHYSIOLOGY MAPPING AND ABLATION      afib    COLONOSCOPY       Family  History   Problem Relation Age of Onset    Diabetes Mother     Heart disease Mother     Diabetes Father     Other (mitral valve disease) Brother     Anesthesia problems Neg Hx     Malignant hyperthermia Neg Hx      Social History     Socioeconomic History    Marital status: Married   Occupational History    Occupation: Engineer, drilling: Teacher, English as a foreign language of [employer]   Tobacco Use    Smoking status: Never    Smokeless tobacco: Never   Vaping Use    Vaping status: Never Used   Substance and Sexual Activity    Alcohol use: Yes     Alcohol/week: 2.4 - 4.2 oz     Types: 4 - 7 Glasses of Wine (5 oz) per week     Comment: 0.5-1 glass daily    Drug use: No    Sexual activity: Yes     Partners: Female   Other Topics Concern    Do you exercise at least a day, 3 or more days a week? Yes    Types of Exercise? (List in Comments) Yes     Comment: Walking, cycling, gym    Do you follow a special diet? No    Vegan? No    Vegetarian? No    Pescatarian? No    Lactose Free? No    Gluten Free? No    Omnivore? Yes    1. Need Help Feeding Yourself? No    2. Need Help Getting Dressed? No    3. Need Help Using the Telephone? No    4. 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 Need Help Shopping for Groceries? No    6. Need Help Getting Places Beyond Walking Distance? (Bus, Taxi) No    7. Need Help Getting from Bed to Chair? No 8. Need Help Bathing or Showering? No    9. Need Help Taking your Medications? No    10.  Need Help Doing Moderately Strenuous Housework? (ex. Laundry) No    11. Need Help Driving? No    12. Need Help Getting to the Toilet? No    13. Need Help Walking Across the Road? (Includes [place], Walker) No    14. Need Help Preparing Meals? No    15. Need Help Shopping for Personal Items? (Toiletries, Medicines) No    16. Need Help Climbing a Flight of Stairs? No    17. Do you live with someone who assists you at home? No    18. Do you get help from family members or friends in your home? No    19. Do you employ someone to provide health related care or help you in your home? No    20. Do you provide care for a family member? No    21. Does your home have rugs in the hallway? No    22. Does your home have poor lighting? No    23. Does your home lack grab bars in the bathroom? Yes    24. Does your home lack handrails on the stairs? No    25. Have you noticed any hearing difficulties? Yes    26. Do you currently participate in any regular activity to improve or maintain your physical fitness? Yes    27. Do you always wear a seatbelt when you ride in a car?  Yes    28. If you drink alcohol, do you drink more than 7 drinks per week or more than 3 drinks on any given day? No    29. Has anyone ever been concerned about your drinking? No   Social History Narrative    Diet: lower appetite over the last year, working on portion size, eating more vegetables and lighter proteins     Exercise: walking more, 5K this weekend     Sleep: 7-8 hours last night      Allergies   Allergen Reactions    Sulfa Antibiotics Rash     Bactrim  Diffuse maculopapular rash, fever    Naproxen Rash     Immunization History   Administered Date(s) Administered    COVID-19 [employer] and up) PF, 30 mcg/0.3 mL 06/20/2023, 10/24/2023    COVID-19, mRNA, (Moderna) 100 mcg/0.5 mL 11/19/2019, 12/17/2019, 07/01/2020, 12/02/2020    COVID-19, mRNA, bivalent (Moderna) 50 mcg/0.5 mL (12y and up) 05/25/2021, 09/14/2021    Influenza vaccine IM quadrivalent (Afluria Quad) (PF) SYR (41 years of age and older) 08/01/2017    Tdap 06/23/2009    influenza vaccine IM quadrivalent (Fluarix Quad) (PF) SYR (70 months of age and older) 05/20/2018    influenza vaccine IM trivalent adjuvanted (FluAD) (PF) SYR (95 years of age and older) 06/20/2023    influenza, unspecified formulation 06/23/2009, 10/30/2013, 07/16/2014, 09/30/2015, 08/01/2017, 09/20/2017, 09/14/2021    pneumococcal conjugate vaccine 20-valent (Prevnar 20) 10/24/2023    pneumococcal polysaccharide vaccine 23-valent (Pneumovax) 03/18/2017    zoster vac recomb adjuvanted (Shingrix) 01/20/2018     Health Maintenance Due   Topic Date Due    Hepatitis B Screening  Never done    Diabetes: Eye Exam  Never done    HIV Screening  Never done    Shingles (Shingrix) Vaccine (2 of 2) 03/17/2018    Tdap/Td Vaccine (2 - Td or Tdap) 06/24/2019    Advance Directive  Never done    Diabetes: HGB A1C  12/18/2023       Review of Systems:   A 14-system review of systems was performed and is negative except as stated in the history of present illness and patient intake form (scanned in chart).     Working   Health is good overall     Patient Care Team  Patient Care Team:  Veronda Prude, MD as PCP - General (Family Medicine)    Counseling performed:  I counseled the patient about the following:       Obesity: 8 minutes         Objective:        BP 155/94  ~ Pulse 87  ~ Temp 36.8 ?C (98.2 ?F) (Tympanic)  ~ Wt 215 lb (97.5 kg)  ~ BMI 33.67 kg/m?   BP Readings from Last 3 Encounters:   10/24/23 155/94   11/19/20 174/99   05/16/20 157/93     Wt Readings from Last 10 Encounters:   10/24/23 1544 215 lb (97.5 kg)   11/19/20 1515 208 lb (94.3 kg)   05/16/20 1442 207 lb (93.9 kg)   04/07/20 1458 207 lb (93.9 kg)   02/12/20 0956 194 lb (88 kg)   02/11/20 1334 204 lb 6.4 oz (92.7 kg)   12/24/19 1242 195 lb (88.5 kg)   07/03/18 1611 206 lb (93.4 kg)   06/18/18 1152 203 lb (92.1 kg)   05/26/18 0930 210 lb 3.2 oz (95.3 kg)       General Appearance:  Alert, cooperative, no distress, appears stated age     Head:    Normocephalic, without obvious abnormality, atraumatic     Eyes:    PERRL, conjunctiva/corneas clear, EOM's intact     Ears:    Normal TM's and external ear canals, both ears     Nose:   Nares normal   Throat:   Lips, mucosa, and tongue normal     Neck:   Supple, symmetrical, trachea midline, no adenopathy;        thyroid:  No enlargement/tenderness/nodules; no carotid    bruit or JVD     Back:     Symmetric, no curvature     Lungs:     Clear to auscultation bilaterally, respirations unlabored     Chest wall:    No tenderness or deformity     Heart:    Regular rate and rhythm, S1 and S2 normal, no murmur, rub    or gallop     Abdomen:     Soft, non-tender, bowel sounds active,     no masses, no organomegaly     Genitalia:    Normal male without lesion, testis nl, no hernias     Rectal:    def       Extremities:   Extremities normal, atraumatic, no cyanosis or edema     Pulses:   2+ and symmetric      Skin:   Skin color, texture, turgor normal       Neurologic:   nonfocal         Lab Review:   Lab Results   Component Value Date    CHOL 182 06/15/2019    CHOLHDL 49 06/15/2019    CHOLDLCAL 94 06/15/2019    TRIGLY 195 (H) 06/15/2019      Admission on 11/19/2020, Discharged on 11/19/2020   Component Date Value Ref Range Status    Ventricular Rate 11/19/2020 60  BPM Final    Atrial Rate 11/19/2020 60  BPM Final    P-R Interval 11/19/2020 134  ms Final    QRS Duration 11/19/2020 100  ms Final    Q-T Interval 11/19/2020 418  ms Final    QTC Calculation (Bezet) 11/19/2020 418  ms Final    P Axis 11/19/2020 35  degrees Final    R Axis 11/19/2020 30  degrees Final    T Axis 11/19/2020 -79  degrees Final    Diagnosis 11/19/2020 Normal sinus rhythm   Final    Diagnosis 11/19/2020 T wave abnormality, consider inferior ischemia   Final    Diagnosis 11/19/2020 Abnormal electrocardiogram Final    Diagnosis 11/19/2020 Confirmed by CHO, MD, GEOFFREY ([id number]) on 11/19/2020 6:03:01 PM   Final    Sodium 11/19/2020 138  135 - 146 mmol/L Final    Potassium 11/19/2020 3.5 (L)  3.6 - 5.3 mmol/L Final    Chloride 11/19/2020 101  96 - 106 mmol/L Final    Total CO2 11/19/2020 25  20 - 30 mmol/L Final    Anion Gap 11/19/2020 12  8 - 19 mmol/L Final    Creatinine 11/19/2020 1.13  0.60 - 1.30 mg/dL Final    GFR Estimate for African American 11/19/2020 80  See GFR Additional Information mL/min/1.73m2 Final    GFR Estimate for Non-African Ameri* 11/19/2020 69  See GFR Additional Information mL/min/1.20m2 Final    GFR Additional Information 11/19/2020 See Comment   Final    GFR >89        Normal  GFR 60 - 89    Normal to mildly decreased  GFR 45 - 59    Mildly to moderately decreased  GFR 30 - 44    Moderately to severely decreased  GFR 15 - 29    Severely decreased  GFR <15        Kidney failure  The CKD-EPI equation was used to estimate GFR and assumes stable creatinine concentrations.  Results are in mL/min/1.73 square meters.    Glucose 11/19/2020 139 (H)  65 - 99 mg/dL Final    White Blood Cell Count 11/19/2020 15.08 (H)  4.16 - 9.95 x10E3/uL Final    Red Blood Cell Count 11/19/2020 5.26  4.41 - 5.95 x10E6/uL Final    Hemoglobin 11/19/2020 14.6  13.5 - 17.1 g/dL Final    Hematocrit 40/05/2724 43.9  38.5 - 52.0 % Final    Mean Corpuscular Volume 11/19/2020 83.5  79.3 - 98.6 fL Final    Mean Corpuscular Hemoglobin 11/19/2020 27.8  26.4 - 33.4 pg Final    MCH Concentration 11/19/2020 33.3  31.5 - 35.5 g/dL Final    Red Cell Distribution Width-SD 11/19/2020 43.3  36.9 - 48.3 fL Final    Red Cell Distribution Width-CV 11/19/2020 14.3  11.1 - 15.5 % Final    Platelet Count, Auto 11/19/2020 248  143 - 398 x10E3/uL Final    Mean Platelet Volume 11/19/2020 9.8  9.3 - 13.0 fL Final    Nucleated RBC%, automated 11/19/2020 0.0  No Ref. Range % Final    Percent Reference Range Not Reported per accrediting agency    Absolute Nucleated RBC Count 11/19/2020 0.00  0.00 - 0.00 x10E3/uL Final    Troponin I 11/19/2020 <0.04  <0.04 ng/mL Final    NOTE: Effective FEB-16-2022, new reference range due to change in analysis method.    Total Protein 11/19/2020 7.3  6.1 - 8.2 g/dL Final    Albumin 36/64/4034 4.1  3.9 - 5.0 g/dL Final    Bilirubin,Total 11/19/2020 0.9  0.1 - 1.2 mg/dL Final    Bilirubin,Conjugated 11/19/2020 0.4 (H)  <=0.3 mg/dL Final    Alkaline Phosphatase 11/19/2020 87  37 - 113 U/L Final    Aspartate Aminotransferase 11/19/2020 97 (H)  13 - 62 U/L Final    NOTE: New Reference Range.      Alanine Aminotransferase 11/19/2020 66  8 - 70 U/L Final    NOTE: New Reference Range.        Lipase 11/19/2020 22  13 - 69 U/L Final    NOTE: New Reference Range.      Extra Tube 11/19/2020 Performed   Final    Auto resulted.    Extra Tube 11/19/2020 Performed   Final    Auto resulted.    Extra Tube 11/19/2020 Performed   Final    Auto resulted.               Assessment & Plan:        Jceon Alverio is a 68 y.o. male here for his yearly physical exam.  Diet, sleep and exercise were discussed.    Diagnoses and all orders for this visit:    Wellness examination  -     CBC; Future  -     Comprehensive Metabolic Panel; Future  -     Lipid Panel; Future  -     Hgb A1c; Future  -     TSH with reflex FT4, FT3; Future  -     Urinalysis w/Reflex  to Culture; Future  -     Cancel: Albumin/Creat Ratio Ur; Future  -     PSA,Screening; Future    Type 2 diabetes mellitus without microalbuminuria, without long-term current use of insulin (HCC/RAF)  Obesity   Chronic  Not at goal   Just started rybelsus  Increase dose prn   Discussed ozempic/mounjaro - patient wishes to defer for now.  Does not want to use an injection     -     Referral to Podiatry  -     Referral to Ophthalmology  -     Referral for Authorization for MNT    Hemorrhoids, unspecified hemorrhoid type  Chronic   -     Referral to Surgery, Colon & Rectal    Skin lesions  Chronic   -     Referral to Dermatology    Urinary frequency with bph  Chronic   -     Referral to Urology, Adult Urology    Paroxysmal atrial fibrillation (HCC/RAF)  Chronic  Currently in SR   S/p ablation  Discuss anticoagulation with cardiology   -     metoprolol tartrate 50 mg tablet; Take 1 tablet (50 mg total) by mouth two (2) times daily.  -      Cardiology    OSA on CPAP  Chronic  Needs travel cpap   -     DME (Durable Medical Equipment) AMB    Need for prophylactic vaccination against Streptococcus pneumoniae (pneumococcus)  -     pneumococcal conjugate vaccine 20-valent (PREVNAR 20) - preferred vaccine for all individuals >=50    Need for COVID-19 vaccine  -     COVID-19 vaccine IM (Pfizer) 12 yr+    Hypertension associated with diabetes (HCC/RAF)  Chronic, not at goal  Double metoprolol dose     Dyslipidemia associated with type 2 diabetes mellitus (HCC/RAF)  Chronic  Not at goal  Double dose of atorvastatin   -     atorvastatin 40 mg tablet; Take 1 tablet (40 mg total) by mouth daily.      Benign prostatic hyperplasia, unspecified whether lower urinary tract symptoms present  Chronic   -     Referral to Urology, Adult Urology    Lipoma of torso  Lipoma on right upper back  Check ultrasound  -     [place] back lump or bump non-vascular; Future    Travel   -     atovaquone-proguanil 250-100 mg tablet; Take 1 tablet by mouth daily.  -     typhoid vaccine (VIVOTIF) capsule; Take 1 capsule by mouth every other day.    Patient Instructions   Please have the tdap and shingles vaccine at your local pharmacy   Ask the pharmacy if hepatitis A and polio vaccines are covered by your insurance and if they are available at the pharmacy     Lifestyle recommendations for overall health.    Diet - I recommend a Mediterranean diet that includes fresh vegetables and fruit with whole grains and low fat protein.  Avoid simple sugars.  Avoid fruit juice.  Avoid fried and processed foods, including chips.  Exercise - I recommend at least 150 minutes of moderate exercise per week to decrease heart disease risk, improve sleep and mental health, and manage weight.  Also incorporate weight training or body weight exercises twice a week.  Do not smoke!  Smoking causes cancers, early heart disease, lung disease, and wrinkles.    If you drink  alcohol please practice moderation because alcohol increases the risk of many cancers, including breast cancer.  CDC guidelines: Men - no more than 2 drinks per day; Women - no more than 1 drink per day.    Sleep - Get 7-9 hours of sleep per night.  If you have to press the snooze button in the morning you are not getting enough sleep :)      Cardiology - Dr. Peggye Pitt       Return in about 3 months (around 01/24/2024) for diabetes.    Veronda Prude, MD  Family Medicine  CPN [facility]

## 2023-10-24 NOTE — Patient Instructions
 Please have the tdap and shingles vaccine at your local pharmacy   Ask the pharmacy if hepatitis A and polio vaccines are covered by your insurance and if they are available at the pharmacy     Lifestyle recommendations for overall health.    Diet - I recommend a Mediterranean diet that includes fresh vegetables and fruit with whole grains and low fat protein.  Avoid simple sugars.  Avoid fruit juice.  Avoid fried and processed foods, including chips.  Exercise - I recommend at least 150 minutes of moderate exercise per week to decrease heart disease risk, improve sleep and mental health, and manage weight.  Also incorporate weight training or body weight exercises twice a week.  Do not smoke!  Smoking causes cancers, early heart disease, lung disease, and wrinkles.    If you drink alcohol please practice moderation because alcohol increases the risk of many cancers, including breast cancer.  CDC guidelines: Men - no more than 2 drinks per day; Women - no more than 1 drink per day.    Sleep - Get 7-9 hours of sleep per night.  If you have to press the snooze button in the morning you are not getting enough sleep :)      Cardiology - Dr. Peggye Pitt

## 2023-10-25 LAB — PSA,Screening: PSA,SCREENING: 2.2 ng/mL (ref 0–4.5)

## 2023-10-25 LAB — UA,Dipstick: BILIRUBIN: NEGATIVE (ref 1.005–1.030)

## 2023-10-25 LAB — TSH with reflex FT4, FT3: TSH: 1.1 u[IU]/mL (ref 0.3–4.7)

## 2023-10-25 LAB — Comprehensive Metabolic Panel: POTASSIUM: 3.8 mmol/L (ref 3.6–5.3)

## 2023-10-25 LAB — CBC: RED CELL DISTRIBUTION WIDTH-SD: 44.3 fL (ref 36.9–48.3)

## 2023-10-25 LAB — UA,Microscopic: WBCS HPF: 1 {cells}/[HPF] (ref 0–4)

## 2023-10-25 LAB — Lipid Panel: CHOLESTEROL: 164 mg/dL (ref >40–<150)

## 2023-10-25 LAB — Hgb A1c: HGB A1C: 7.7 % HbA1c — ABNORMAL HIGH (ref <5.7)

## 2023-10-25 MED ORDER — ATORVASTATIN CALCIUM 40 MG PO TABS
40 mg | ORAL_TABLET | Freq: Every day | ORAL | 3 refills | Status: AC
Start: 2023-10-25 — End: ?

## 2023-10-25 MED ORDER — TYPHOID VACCINE PO CPDR
1 | ORAL_CAPSULE | ORAL | 0 refills | Status: AC
Start: 2023-10-25 — End: ?

## 2023-10-25 MED ORDER — ATOVAQUONE-PROGUANIL HCL 250-100 MG PO TABS
1 | ORAL_TABLET | Freq: Every day | ORAL | 0 refills | Status: AC
Start: 2023-10-25 — End: ?

## 2023-10-25 MED ORDER — METOPROLOL TARTRATE 50 MG PO TABS
50 mg | ORAL_TABLET | Freq: Two times a day (BID) | ORAL | 3 refills | Status: AC
Start: 2023-10-25 — End: ?

## 2023-10-29 ENCOUNTER — Ambulatory Visit: Payer: BLUE CROSS/BLUE SHIELD

## 2023-11-01 ENCOUNTER — Telehealth: Payer: PRIVATE HEALTH INSURANCE

## 2023-11-01 NOTE — Telephone Encounter
Scheduled patient's initial appointment with cardiology. Patient's referral has been authorized.

## 2023-11-04 ENCOUNTER — Other Ambulatory Visit: Payer: PRIVATE HEALTH INSURANCE

## 2023-11-05 ENCOUNTER — Ambulatory Visit: Payer: PRIVATE HEALTH INSURANCE

## 2023-11-05 DIAGNOSIS — I4891 Unspecified atrial fibrillation: Secondary | ICD-10-CM

## 2023-11-05 NOTE — Consults
 OUTPATIENT CARDIOLOGY CONSULT    Date of Consult: 11/05/2023  Referring Provider: Veronda Prude, MD    Reason for Consult: Atrial fibrillation     Problem List/Past Medical History:  Patient Active Problem List   Diagnosis    Atrial fibrillation (HCC/RAF)    Benign prostatic hyperplasia    Erectile dysfunction    HTN (hypertension)    Hyperlipidemia    Long term current use of anticoagulant    OSA on CPAP    Obesity    Urinary tract infectious disease    Hypertensive urgency    Diabetes mellitus (HCC/RAF)    Angiomyolipoma of right kidney    History of colon polyps    Liver cyst    Microalbuminuria due to type 2 diabetes mellitus (HCC/RAF)       History of Present Illness:   Calvin Chen is a 68 y.o. male who has a past medical history significant for the above who is referred to Cardiology to establish care.     Patient was diagnosed with paroxysmal atrial fibrillation in 07/2017 s/p RF ablation on 12/28/21. CHA2DS2-VASc Score of 2 = 2.2%. He remains on Xarelto and Metoprolol. 03/2022 Holter post ablation with no AF. Patient is asymptomatic from a cardiac perspective. No CP, SOB/DOE or palpitations. Exercises by walking at least an hour daily without symptoms. There has been no change in exercise tolerance.     ACC/AHA Cholesterol Management Guideline Recommendations:   Moderate or high-intensity statin because of diabetes or 10-year ASCVD risk is >= 10%. Patient is currently receiving guideline-directed statin therapy.    ASCVD Risk Score    10-year ASCVD risk  is 24.8% as of 2:22 PM on 11/05/2023.   10-year ASCVD risk with optimal risk factors  is 8.8%.   Values used to calculate ASCVD Risk Score    Age  68 y.o.     Gender  male   Race  Choose Not to Answer   HDL Cholesterol  49 mg/dL. (measured on 10/24/2023)   LDL Cholesterol  83 mg/dL. (measured on 10/24/2023)   Total Cholesterol  164 mg/dL. (measured on 10/24/2023)   Systolic Blood Pressure  132 mm Hg. (measured on 11/05/2023)   Blood Pressure Medication Present  Yes Smoking Status  currently not a smoker   Diabetes Present  Yes     Click here for the [facility] ASCVD Cardiovascular Risk Estimator Plus tool Office manager).       10-year ASCVD risk  is 24.8% as of 2:22 PM on 11/05/2023    Review of Systems:   Please see HPI for pertinent positives and negatives; otherwise a complete 14 point ROS was reviewed and found to be negative.     Allergies:  Allergies   Allergen Reactions    Sulfa Antibiotics Rash     Bactrim  Diffuse maculopapular rash, fever    Naproxen Rash       Lipids:  Last 3 Lipids (Up to last 3 results from the past 14782 hours)        03/06 1700    Cholesterol       164  Comment: The significance of total cholesterol depends on the values of LDL, HDL, triglycerides and the clinical context. A patient-provider discussion may be considered.         Cholesterol, HDL       49  Comment: If HDL cholesterol level falls outside of the designatedrange, a patient-provider discussion is recommended         Triglycerides  158  Comment: If Triglyceride level falls outside of the designated range,a patient-provider discussion is recommended.         Non-HDL,Chol,Calc       115  Comment: If Non-HDL cholesterol level falls outside of the designatedrange, a patient-provider discussion is recommended.                 Diabetes/Diabetes Risk:  Hgb A1c   Date Value Ref Range Status   10/24/2023 7.7 (H) <5.7 % HbA1c Final     Comment:     For patients with diabetes, an A1c less than (<) or equal (=) to 7.0% is recommended for most patients, however the goal may be higher or lower depending on age and/or other medical problems.     For a diagnosis of diabetes, A1c greater than (>) or equal(=) to 6.5% indicates diabetes; values between 5.7% and 6.4% may indicate an increased risk of developing diabetes.    Hemoglobin A1c is determined with Roche Cobas immunoassay. The presence of abnormal hemoglobin variants, Thalassemia, and increased concentration of HbF (>7%), may result in lower HbA1c values.   12/24/2019 6.3 (H) <5.7 % Final     Comment:     For patients with diabetes, an A1c less than (<) or equal (=) to 7.0% is recommended for most patients, however the goal may be higher or lower depending on age and/or other medical problems.   For a diagnosis of diabetes, A1c greater than (>) or equal(=) to 6.5% indicates diabetes; values between 5.7% and 6.4% may indicate an increased risk of developing diabetes.   06/15/2019 6.3 (H) <5.7 % Final     Comment:     For patients with diabetes, an A1c less than (<) or equal (=) to 7.0% is recommended for most patients, however the goal may be higher or lower depending on age and/or other medical problems.   For a diagnosis of diabetes, A1c greater than (>) or equal(=) to 6.5% indicates diabetes; values between 5.7% and 6.4% may indicate an increased risk of developing diabetes.        Current Medications:    Current Outpatient Medications:     atorvastatin 40 mg tablet, Take 1 tablet (40 mg total) by mouth daily., Disp: 90 tablet, Rfl: 3    atovaquone-proguanil 250-100 mg tablet, Take 1 tablet by mouth daily., Disp: 25 tablet, Rfl: 0    clindamycin 1% gel, apply thin layer topically to affected area once daily, Disp: , Rfl:     ketoconazole 2% cream, apply to affected area once daily FOR 6 WEEKS, Disp: , Rfl:     LISINOPRIL-HYDROCHLOROTHIAZIDE 20-25 mg tablet, TAKE 2 TABLETS DAILY, Disp: 180 tablet, Rfl: 1    metFORMIN 500 mg tablet, Take 1 tablet (500 mg total) by mouth two (2) times daily., Disp: , Rfl:     metoprolol tartrate 50 mg tablet, Take 1 tablet (50 mg total) by mouth two (2) times daily., Disp: 180 tablet, Rfl: 3    rivaroxaban (XARELTO) 20 mg tablet, Take 1 tablet (20 mg total) by mouth daily with dinner Please make appointment for follow up., Disp: 90 tablet, Rfl: 0    RYBELSUS 3 MG TABS, , Disp: , Rfl:     typhoid vaccine (VIVOTIF) capsule, Take 1 capsule by mouth every other day., Disp: 4 capsule, Rfl: 0    Social History:  Social History     Socioeconomic History    Marital status: Married   Occupational History    Occupation: Pensions consultant  Employer: [employer]   Tobacco Use    Smoking status: Never    Smokeless tobacco: Never   Vaping Use    Vaping status: Never Used   Substance and Sexual Activity    Alcohol use: Yes     Alcohol/week: 2.4 - 4.2 oz     Types: 4 - 7 Glasses of Wine (5 oz) per week     Comment: 0.5-1 glass daily    Drug use: No    Sexual activity: Yes     Partners: Female   Other Topics Concern    Do you exercise at least a day, 3 or more days a week? Yes    Types of Exercise? (List in Comments) Yes     Comment: Walking, cycling, gym    Do you follow a special diet? No    Vegan? No    Vegetarian? No    Pescatarian? No    Lactose Free? No    Gluten Free? No    Omnivore? Yes    1. Need Help Feeding Yourself? No    2. Need Help Getting Dressed? No    3. Need Help Using the Telephone? No    4. 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 Need Help Shopping for Groceries? No    6. Need Help Getting Places Beyond Walking Distance? (Bus, Taxi) No    7. Need Help Getting from Bed to Chair? No    8. Need Help Bathing or Showering? No    9. Need Help Taking your Medications? No    10.  Need Help Doing Moderately Strenuous Housework? (ex. Laundry) No    11. Need Help Driving? No    12. Need Help Getting to the Toilet? No    13. Need Help Walking Across the Road? (Includes [place], Walker) No    14. Need Help Preparing Meals? No    15. Need Help Shopping for Personal Items? (Toiletries, Medicines) No    16. Need Help Climbing a Flight of Stairs? No    17. Do you live with someone who assists you at home? No    18. Do you get help from family members or friends in your home? No    19. Do you employ someone to provide health related care or help you in your home? No    20. Do you provide care for a family member? No    21. Does your home have rugs in the hallway? No    22. Does your home have poor lighting? No    23. Does your home lack grab bars in the bathroom? Yes    24. Does your home lack handrails on the stairs? No    25. Have you noticed any hearing difficulties? Yes    26. Do you currently participate in any regular activity to improve or maintain your physical fitness? Yes    27. Do you always wear a seatbelt when you ride in a car? Yes    28. If you drink alcohol, do you drink more than 7 drinks per week or more than 3 drinks on any given day? No    29. Has anyone ever been concerned about your drinking? No   Social History Narrative    Diet: lower appetite over the last year, working on portion size, eating more vegetables and lighter proteins     Exercise: walking more, 5K this weekend  Sleep: 7-8 hours last night        Family History:  Family history is negative for early cardiac disease, early MI and SCD.     Physical Exam:  Last Recorded Vital Signs:    11/05/23 1351   BP: 132/78   Pulse: 65   Temp: 36.2 ?C (97.1 ?F)   SpO2: 98%     General: well developed well nourished male in no acute distress  HEENT: extraocular movements intact, anicteric, oropharynx clear, moist mucous membranes  Neck: no jugular venous distention.   Heart: regular rate and rhythm, normal S1, S2. no rubs, murmurs, or gallops  Lungs: clear to auscultation bilaterally, no retractions  Abd: non distended  Ext: no clubbing, cyanosis, or edema  Skin: warm, dry , and well perfused  Neuro: grossly non focal  Psych: normal mood and affect    Laboratory Studies:  Lab Results   Component Value Date    WBC 9.75 10/24/2023    HGB 14.2 10/24/2023    HCT 43.3 10/24/2023    MCV 83.6 10/24/2023    PLT 332 10/24/2023      Lab Results   Component Value Date    CREAT 1.14 10/24/2023    BUN 23 (H) 10/24/2023    NA 138 10/24/2023    K 3.8 10/24/2023    CL 97 10/24/2023    CO2 30 10/24/2023      Lab Results   Component Value Date    HGBA1C 7.7 (H) 10/24/2023      Lab Results   Component Value Date    ALT 21 10/24/2023    AST 17 10/24/2023    ALKPHOS 84 10/24/2023    BILITOT 0.3 10/24/2023      Lab Results   Component Value Date    TSH 1.1 10/24/2023      Lab Results   Component Value Date    MG 2.0 (H) 05/22/2018    CALCIUM 9.6 10/24/2023    PHOS 3.9 05/19/2018      Lab Results   Component Value Date    CHOL 164 10/24/2023    CHOLHDL 49 10/24/2023    CHOLDLCAL 83 10/24/2023    TRIGLY 158 (H) 10/24/2023     No results found for: ''HSCRP''    Diagnostic Studies:   EKG, performed on 3//18/25 and reviewed by me: NSR, 62 bpm, inferolateral TWI    OSH Holter 03/2022 - post ablation:   Predominant underlying rhythm was Sinus Rhythm (avg 70 48-123bpm).   Event labeled NSVT is 5 beats of SVT with aberrancy. 14   Supraventricular Tachycardia runs occurred, the run with the   fastest interval lasting 4 beats with a max rate of 182 bpm, the   longest lasting 11 beats with an avg rate of 107 bpm. Isolated SVEs   were rare (<1.0%). Isolated VEs were rare (<1.0%, 25). No events   were noted during the monitoring period.       Assessment:  68 year old M with:     Paroxysmal atrial fibrillation s/p RF ablation on 12/28/21   -- CHA2DS2-VASc Score of 2 = 2.2% Unadjusted ischemic stroke rate (% per year)  -- Mikael Spray 07/2017 diagnosed AF with RVR  -- S/p RFA with PVI on 12/28/21  -- On Xarelto and Metoprolol   2. HTN  3. HLD  4. DMII    Plan:  1. 7-14 Zio patch to assess AF burden.   2. Transthoracic echocardiogram to rule out structural heart disease.   3.  Continue Xarelto 20 mg daily. No bleeding/bruising.   4. Continue Metoprolol 50 mg BID.   5. Continue LISINOPRIL-HYDROCHLOROTHIAZIDE 20-25 mg BID.   6. Continue Metformin 500 mg daily.   7. RTC in 4 weeks to review Zio and Echocardiogram results.      All questions were answered. The patient understands and agrees with the plan stated above.     Thank you for allowing me to participate in the care of your patient. Please do not hesitate to contact me with any questions.     Jeniffer Culliver P. Chales Abrahams, MD, PhD  Assistant Professor  Division of Cardiology  [employer] at [facility]  Phone: [phone]  Pager: [phone]

## 2023-11-18 ENCOUNTER — Other Ambulatory Visit: Payer: BLUE CROSS/BLUE SHIELD

## 2023-11-19 ENCOUNTER — Ambulatory Visit: Payer: BLUE CROSS/BLUE SHIELD

## 2023-11-19 ENCOUNTER — Other Ambulatory Visit: Payer: BLUE CROSS/BLUE SHIELD

## 2023-11-19 NOTE — Telephone Encounter
 I can order but I need sleep study report - ask patient when he had it done

## 2023-11-20 ENCOUNTER — Ambulatory Visit: Payer: BLUE CROSS/BLUE SHIELD

## 2023-11-20 DIAGNOSIS — I4891 Unspecified atrial fibrillation: Secondary | ICD-10-CM

## 2023-11-20 NOTE — Interdisciplinary
Please mail to pt's home. Thank you.

## 2023-11-21 NOTE — Telephone Encounter
 [facility] [place]   [facility] [place]  Outside Information  Office Visit  12/15/2015  SLEEP CLINIC  Calvin Chen - 68 y.o. Chen; Calvin Chen, Calvin Chen  Encounter Summary  , generated on Apr. 02, 2025April 02, 2025   Reason for Visit    Reason for Visit -  Reason Comments   CONSULTATION          Outpatient Service (Routine) - Closed  Reason for Visit - Outpatient Service (Routine) - Closed  Specialty Diagnoses / Procedures Referred By Contact Referred To Contact   Sleep Clinic Diagnoses   OBSTRUCTIVE SLEEP APNEA    Ilda Foil (M.D.), M.D.   [address]   [place], [place] [address]  *[facility] [place] ([facility])   FOR REFERRALS ONLY   [place], [place] [address]      Reason for Visit - Outpatient Service (Routine) - Closed  Referral ID Status Reason Start Date Expiration Date Visits Requested Visits Authorized   [id number] Closed Specialty Services Required  12/07/2015 12/06/2016 1 1        Encounter Details    Encounter Details  Date Type Department Care Team (Latest Contact Info) Description   12/15/2015 10:50 AM PDT Office Visit SLEEP CLINIC   [address]   [place], [place] [address]   [phone]  Calvin Chen (M.D.), M.D.  OBSTRUCTIVE SLEEP APNEA (Primary Dx);  DECLINES VACCINATION;  HYPERTENSION;  BPH (BENIGN PROSTATIC HYPERTROPHY);  HYPERLIPIDEMIA;  DM 2;  OBESITY, BMI Chen-Chen.9, ADULT     Social History  - documented as of this encounter   Social History  Tobacco Use Types Packs/Day Years Used Date   Smoking Tobacco: Never           Smokeless Tobacco: Never             Social History  Alcohol Use Standard Drinks/Week Comments   Yes 0 (1 standard drink = 0.6 oz pure alcohol)       Social History  Substance Use Types Use/Week Comments   No           Social History  Sex and Gender Information Value Date Recorded   Sex Assigned at Birth Not on file     Gender Identity Not on file     Sexual Orientation Not on file       Last Filed Vital Signs  - documented in this encounter   Last Filed Vital Signs  Vital Sign Reading Time Taken Comments   Blood Pressure 131/87 12/15/2015 11:15 AM PDT     Pulse 82 12/15/2015 11:15 AM PDT     Temperature 36.1 ?C (96.9 ?F) 12/15/2015 11:12 AM PDT     Respiratory Rate - -     Oxygen Saturation 98% 12/15/2015 11:15 AM PDT     Inhaled Oxygen Concentration - -     Weight 91.2 kg (201 lb) 12/15/2015 11:12 AM PDT     Height 170.2 cm (5' 7'') 12/15/2015 11:12 AM PDT     Body Mass Index Chen.48 12/15/2015 11:12 AM PDT       Patient Instructions  - documented in this encounter   Patient Instructions  Calvin Chen (L.V.N.), L.V.N. - 12/15/2015 11:20 AM PDT   Formatting of this note is different from the original.  Care Gaps  ? DM Monofilament Foot Exam  ? DM Retinal Photo due  ? Hepatitis B Immunization First Dose Due - High Risk  ? Microalbumin Lab Screening Due ?  Pneumovax (PPSV23) first dose due - high risk age 74 to 28 years  ? Update BMI - Take Height AND Weight  ? Weight Mgmt Referral Recommended      Your [facility] Instructions  Body Mass Index: Care Instructions  Your Care Instructions  Body mass index (BMI) can help you see if your weight is raising your risk for health problems. It uses a formula to compare how much you weigh with how tall you are. A BMI between 18.5 and 24.9 is considered healthy. A BMI between 25 and 29.9 is considered overweight. A BMI of 30 or higher is considered obese.  If your BMI is in the normal range, it means that you have a lower risk for weight-related health problems. If your BMI is in the overweight or obese range, you may be at increased risk for weight-related health problems, such as high blood pressure, heart disease, stroke, arthritis or joint pain, and diabetes.  BMI is just one measure of your risk for weight-related health problems. You may be at higher risk for health problems if you are not active, you eat an unhealthy diet, or you drink too much alcohol or use tobacco products.  Follow-up care is a key part of your treatment and safety. Be sure to make and go to all appointments, and call your doctor if you are having problems. It's also a good idea to know your test results and keep a list of the medicines you take.  How can you care for yourself at home?  ? Practice healthy eating habits. This includes eating plenty of fruits, vegetables, whole grains, lean protein, and low-fat dairy.  ? Get at least 30 minutes of exercise 5 days a week or more. Brisk walking is a good choice. You also may want to do other activities, such as running, swimming, cycling, or playing tennis or team sports.  ? Do not smoke. Smoking can increase your risk for health problems. If you need help quitting, talk to your doctor about stop-smoking programs and medicines. These can increase your chances of quitting for good.  ? Limit alcohol to 2 drinks a day for men and 1 drink a day for women. Too much alcohol can cause health problems.  If you have a BMI higher than 25  ? Your doctor may do other tests to check your risk for weight-related health problems. This may include measuring the distance around your waist. A waist measurement of more than 40 inches in men or 35 inches in women can increase the risk of weight-related health problems.  ? Talk with your doctor about steps you can take to stay healthy or improve your health. You may need to make lifestyle changes to lose weight and stay healthy, such as changing your diet and getting regular exercise.  Where can you learn more?  Go to https://www.cook.com/  Enter S176 in the search box to learn more about ''Body Mass Index: Care Instructions.''  Current as of: October 05, 2014  Content Version: 11.1  ? 2006-2016 [employer]. Care instructions adapted under license by your healthcare professional. If you have questions about a medical condition or this instruction, always ask your healthcare professional. [employer] Inc[employer] disclaims any warranty or liability for your use of this information.    You are due for your Diabetic Retinal (Eye) Exam. Your doctor would like you to complete this as soon as possible. Please call within the next week to schedule your appointment at [phone].  You are due for an important vaccination. You may walk in to one of our nurse/immunization clinics without an appointment (check in upon arrival); see list of locations and hours below.    Clinic Hours and Locations for Walk In Service  [facility] 8:30am-4:30pm (M-F)  Closed for Lunch 12:30-1:30pm [address]  [place], [place] [address]  [facility]  Nurse Clinic 8:30am-4:30pm (M-F) [address] [place], [place] [address]  [facility]  Nurse Clinic 8:30am-12:30pm [phone]  1:30pm-4:30pm [address] [place] [address]  [facility]  Nurse Clinic 9:00am-11am (M-F)  2:00pm-4:00pm (M-F) [address]  [place], [place] [address]  [facility]   Nurse Clinic 8:30am-4:30pm (M-F) [phone]  [employer], [place] [address]    Your physician has ordered a lab test for you. Please go directly to the laboratory or return within the next week to complete your lab work. You do not need to be fasting and you do not need an appointment.    Location Address Hours  [facility] [address]  [place], [place] [address] Mon-Fri: 6:30am-7:00pm  Sat: 6:30am-2:00pm  Sun: 6:30am-12:30pm  [facility]  [address]  [place], [place] [address] Mon-Fri: 8:00am-5:00pm  [place] [address] [place], [place] [address] Mon-Fri: 7:30am-5:00pm  [facility] [address]  [place], [place] [address] Mon-Fri: 8:00am-5:00pm  [address]  [place], [place] [address] Mon-Fri: 7:30am-5:00pm  [place] [address]  [place], [place] [address] Mon-Fri: 8:00am-5:00pm    Your physician recommends that you take advantage of one of our effective weight management programs. Losing weight the healthy way can help increase your energy, improve your self-esteem, and help you live life to its fullest. Please take the time to review our HEALTHY BALANCE brochure and call our Health Education department at [phone] to enroll in a program. You can also call our Wellness Coaching by Phone program at [phone] to help you reach your weight loss goals.    For more information, please visit our website at [email or web address]    Healthy Balance Program Overview Schedule  [facility]  [address]  [place], [place] [address] ?  [facility]  Medical Offices  [address]  [place], [place] [address] ?  [facility]  [address] [place], [place] [address]  ?    YOUR HIGH BLOOD PRESSURE IS NOT IN CONTROL TODAY.  Hypertension (high blood pressure) leads to 1.5 million heart attacks and 500,000 strokes each year. People with high blood pressure generally do not have any symptoms.    Last 3 BP readings:  BP Readings from Last 3 Encounters:  12/15/15 : 131/87  12/07/15 : 131/84  12/06/15 : 121/67    Your goal is to be LESS than 140/90 (or less than 150/90 if over 37 years old without Diabetes or Chronic Kidney Disease).    THINGS YOU CAN DO TO LOWER YOUR BLOOD PRESSURE:  1. Take your blood pressure medications everyday as prescribed. If you are experiencing any side effects, call you doctor as soon as possible. If you need help paying for your medication(s), talk to a representative today about our Medical Financial Assistance [facility]) program by calling [phone].    2. Eat less salt. Your daily recommendation is less than 1,500mg  of sodium per day. To learn more about making healthy food choices, register for  a Health Education class by calling [phone].    3. Get your blood pressure checked again in 2 weeks. There is no copay.  - HYPERTENSION CLINIC - Call [phone] to schedule your appointment  [place] Every Thursday AM (8:10AM-11:10AM)  Every Saturday AM (8:00 AM-11:10AM)  [facility] 2nd and 4th Tuesday PM (1:30PM-4:10PM)  [place] Every Tuesday AM (8:30AM-12:20PM)  Every Thursday PM (2:00PM-4:20PM)  [facility] Every Tuesday PM (1:30PM-4:20PM)  Every Thursday PM (1:30PM-4:20PM)  [facility] Every Wednesday (9:00AM-12:30PM)  [place] Every Tuesday AM (9:00AM-11:50PM)    - NURSE VISIT - Call [phone] to schedule your appointment  - WALK -IN (no appointment needed; check in with 1st floor receptionist)    [facility] [place] Monday - Friday 9:00am - 12:00pm,  2:00pm - 4:00pm  [facility] Monday - Friday 9:00am - 12:00pm,  1:30pm - 4:30pm  [place] Monday - Friday 8:30am - 4:00pm  [facility] Monday - Friday 8:30am - 4:30pm  [place] Monday - Friday 9:00am - 12:00pm,  1:30pm - 4:30pm  [facility] Monday - Friday 9:00am - 4:00pm      Electronically signed by Calvin Chen (L.V.N.), L.V.N. at 12/15/2015 11:30 AM PDT     Progress Notes  - documented in this encounter   Calvin Chen (M.D.), M.D. - 12/15/2015 11:28 AM PDT  Formatting of this note is different from the original.  History:  SLEEP MEDICINE NOTE    REASON FOR CONSULTATION: Nocturia    HISTORY:  Nocturia x years that is bothersome  Seen by urology and is being treated for prostatitis with some improvement in symptoms  Urologist thought that untreated OsA may be contributing  Also pre-DM but being followed by PMD    Was diagnosed with OSA even prior to the HST here. Has a CPAP from >5 years ago that he could not tolerate. Does not recall how long he even tried it but felt that it was too noisy and that the pressures were far too high    BED TIME: 10-11pm  SLEEP TIME: within 15 minutes  NOCTURNAL AWAKENINGS: 3-5x usually for nocturia  WAKE UP TIME: 6-7am    CAFFEINE INTAKE: yes    RESTLESS LEG SYMPTOMS: Y/N  HYPNOPOMPIC/GOGIC HALLUCINATIONS: n/a  CATAPLEXY: n/a  SLEEP PARALYSIS: n/a    SNORING: +++  WITNESSED APNEAS: ++  ESS: 13    PERTINENT PAST MEDICAL HISTORY:  DM  HTN    ROS:  GEN: no recent weight gain, mild weight loss  HEENT: +sinus congestion, snoring  PULM: no self perceived apneas  NEURO: no AM headaches, +fatigue  PSYCH: endorses some anxiety  GI: denies GERD symptoms    PHYSICAL EXAM  GEN: in NAD, speaking full sentences  HEENT: MMP II  CVS: RRR no RMG  PULM: CTA bilaterally no RRW  EXT: mild right ankle swelling with discomfort on weight bearing-->being worked up    No results found for this basename: TSH    WBC'S AUTO 10.4 01/09/2013  HGB 15.4 01/09/2013  HCT AUTO 45.0 01/09/2013  PLT'S AUTO 305 01/09/2013    K 4.2 05/23/2015  NA 141 05/23/2015  CL 100 05/23/2015  CO2 28 05/23/2015  No results found for this basename: BUN  CREAT 1.17 09/16/2015    ALT 22 01/12/2014    No results found for this basename: Ferritin    HsT 2014  Duration of Sleep:    404.3 minutes  AHI:   (total)  25.7 events/hour  AHI:   (supine)          64 events/hour  Average Oxygen Sat:   Did not record  Lowest Oxygen Sat:     Did not record  Duration < 90% Sat:    Did not record  Epworth Sleepiness Scale: 9 out of 24     (>10 considered excessively sleepy)    There was moderate elevation of sleep related obstructive  breathing events during the study, producing an Apnea/Hypopnea  Index (AHI) of 25.7 events/hour.    AHI was significantly higher when the patient was in the supine  position.    CPAP titration Study:    CPAP titration study was performed using REMStar Auto with A-flex  and, if indicated, a pulse oximeter.    Application of CPAP of 13.4cmH2O was 95% effective in treating  the patient's apneic/hypopneic events, decreasing the AHI to 6.5  events/hour.    The patient's hypoxemia was relieved by the application of CPAP  at the above settings.    The patient does not indicate that CPAP allowed deeper sleep and  a feeling of being more awake the following morning.    The patient indicated that he/she was willing to use the CPAP  device if recommended by the physician    ==========================================================================================================================  ASSESSMENT/PLAN:    1)Nocturia  -multifactorial but agree that untreated OSA may be contributing  -continue to treat underlying GU issues, monitor for DM  -minimize fluid intake before bedtime    2)Moderate OSA  -previously did not tolerate cpap  3)HTN  -room to optimize    Plan  -discussed risks of untreated moderate (and nearly severe) OSA including but not limited to risk of MI, cHF, arrhythmias, CVA  -agree that it may be contributing to nocturia--nocturnal interruptions allow for sensation of bladder fullness and may also be associated with increased bnp secretion  -discussed alternative treatment options including oral appliance, lateral positioning but recommended a repeat trial of APaP given newer devices that are quiter and broader range of mask interfaces and he agreed  -APAP titration was set up for him today; device will be ordered accordingly  -encouraged weight loss  -decrease fluid intake before bed  -avoid alcohol and sedatives  -avoid driving while drowsy; we spent time addressing this and he expressed clear understanding of the risks    tav in 4-6 weeks    Electronically signed by:  Calvin Fish MD  12/15/2015  11:28 AM                Electronically signed by Calvin Chen (M.D.), M.D. at 12/15/2015 12:41 PM PDT   Nursing Notes  - documented in this encounter   Calvin Chen (L.V.N.), L.V.N. - 12/15/2015 11:21 AM PDT  Formatting of this note might be different from the original.  PROACTIVE CARE ACTIONS    Proactive Office Encounter Actions: ---DIABETES---  Diabetic Member: Member informed to schedule foot exam with PCP within next 2 weeks  ---HYPERTENSION---  Blood Pressure above goal, repeat blood pressure taken and documented  ---LABS, IMMUNIZATIONS, PROCEDURES, MEDICATIONS, OTHER---  Member instructed to go to lab to have pre-ordered labs drawn  Pneumovax immunization declined  HIB immunization declined  --- LIFESTYLE AND HEALTH EDUCATION ---  Member given Patient Instructions on BMI/Weight Management    Calvin Chen 60 minutes 3 days per week at a moderate or strenuous level.    BP: 131/87 mmHg  BP Patient Position: STANDING  Cuff Size: Standard Adult  BP Location: RA-RIGHT ARM  BP Source: AUTOMATIC  Pulse: 82  Temp: 96.9 ?F (36.1 ?C)  Temp src: Tympanic  Wt - Scale: 201 lb (91.173 kg)  Height: 5' 7'' (170.2 cm)  SpO2: 98 %    Electronically signed by Calvin Chen (L.V.N.), L.V.N. at 12/15/2015 11:28 AM PDT   Plan of Treatment  - documented as of this encounter   Not on file   Procedures  - documented in this encounter   Not on file  Visit Diagnoses  - documented in this encounter   Visit Diagnoses  Diagnosis   OBSTRUCTIVE SLEEP APNEA - Primary    DECLINES VACCINATION   VACCINE REFUSED BY PATIENT    HYPERTENSION   HTN    BPH (BENIGN PROSTATIC HYPERPLASIA)   BPH (BENIGN PROSTATIC HYPERTROPHY)    HYPERLIPIDEMIA    DM 2    OBESITY, BMI Chen-Chen.9, ADULT      Care Teams  - documented as of this encounter   Care Teams  Team Member Relationship Specialty Start Date End Date   Elia Groom (M.D.), M.D.   NPI: [id number]   [address]   [place], [place] [address]  PCP - General Internal Medicine 10/21/13 05/30/16     Patient Demographics    Patient Demographics  Patient Address Patient Name Communication   [address] [facility])  [place], [place] [address]    Former (Apr. 14, 2009 - Aug. 28, 2017):  [address] [facility])  [place], [place] [address] [place] [phone] (Home)  [phone] (Mobile)  [email or web address]     Patient Demographics  Language Race / Ethnicity Marital Status   English - Spoken (Preferred)  English - Written (Preferred) Unknown / Unknown Married     Patient Contacts    Patient Contacts  Contact Name Contact Address Communication Relationship to Patient Tysen Roesler Unknown [phone] [facility]) Wife, Personal Relationship   Kayde Warehime Unknown [phone] (Mobile) Brother, Personal Relationship     Document Information    Primary Care Provider Other Service Providers Document Coverage Dates   Dorean Gambles (M.D.) Eloy Half, M.D. (Mar. 04, 2015March 04, 2015 - Oct. 11, 2017October 11, 2017)  NPI: [id number]  [address]  [place], [place] [address]  Internal Medicine  [facility] [place]   [address]  [place], [place] [address]  Apr. 27, 2017April 27, 2017     Custodian Organization   [facility] [place]   [address]  [place], [place] [address]     Encounter Providers Encounter Date   Allena Ard (M.D.) Achille Ache, M.D. (Attending)  NPI: [id number]   Pulmonary Disease Apr. 27, 2017April 27, 2017     Legal Authenticator    Vessie Gouge (M.D.) [facility], M.D.  [address]  [place], [place] [address]  Family Medicine (addiction medicine)

## 2023-11-21 NOTE — Telephone Encounter
 I entered an order but the insurance may not cover it without a recent sleep study    Please have patient schedule ov with sleep specialist so he can have another sleep study

## 2023-11-25 ENCOUNTER — Telehealth: Payer: BLUE CROSS/BLUE SHIELD

## 2023-11-25 NOTE — Telephone Encounter
 Call Back Request      Reason for call back: Pt stated that he was told by pcp to follow up with Cornelius Dill to schedule a sleep study. No order on file for the sleep test. CBN [phone]   Pt would like a call back today.     Any Symptoms:  []  Yes  [x]  No      If yes, what symptoms are you experiencing:    Duration of symptoms (how long):    Have you taken medication for symptoms (OTC or Rx):      If call was taken outside of clinic hours:    [] Patient or caller has been notified that this message was sent outside of normal clinic hours.     [] Patient or caller has been warm transferred to the physician's answering service. If applicable, patient or caller informed to please call us  back if symptoms progress.  Patient or caller has been notified of the turnaround time of 1-2 business day(s).

## 2023-11-25 NOTE — Telephone Encounter
 Scheduled pt for 5/1, called spoke to patient provided appt details.

## 2023-12-05 ENCOUNTER — Ambulatory Visit: Payer: BLUE CROSS/BLUE SHIELD | Attending: Student in an Organized Health Care Education/Training Program

## 2023-12-05 DIAGNOSIS — D492 Neoplasm of unspecified behavior of bone, soft tissue, and skin: Secondary | ICD-10-CM

## 2023-12-05 DIAGNOSIS — M67449 Ganglion, unspecified hand: Secondary | ICD-10-CM

## 2023-12-05 DIAGNOSIS — L820 Inflamed seborrheic keratosis: Secondary | ICD-10-CM

## 2023-12-05 DIAGNOSIS — L309 Dermatitis, unspecified: Secondary | ICD-10-CM

## 2023-12-05 DIAGNOSIS — L570 Actinic keratosis: Secondary | ICD-10-CM

## 2023-12-05 DIAGNOSIS — L821 Other seborrheic keratosis: Secondary | ICD-10-CM

## 2023-12-05 NOTE — Patient Instructions
 Post-Biopsy Care Instructions    You have had a biopsy taken of your skin.  Follow these instructions to care for your biopsy site.     At least daily:    1. Clean the wound with soap and warm water.     2. Pat dry.     3. Apply Aquaphor or Vaseline.      4. Cover with a Band-aid.     If you have stitches, continue wound care until they're removed. If you don't have stitches, take these wound care steps until the skin is healed (may take weeks).    Things to Note:  Sometimes the biopsy site bleeds after you leave the clinic. This is more likely in people taking blood-thinning medicine. If this occurs, apply direct pressure to the wound for 20 minutes, then look at it. If bleeding continues, apply pressure for another 20 minutes. If bleeding still continues after that, contact your health care provider.  If the area hurts, it is ok to take over-the-counter acetaminophen (Tylenol)  Avoid aspirin, alcohol  All biopsies leave scars. They tend to fade with time. The scar's permanent color will be set 1 to 2 years after the biopsy.  Avoid any strenuous exercise or activity (i.e. bending, lifting heavy objects) that could be harmful to the wound for at least three weeks.  Avoid bumping the area or doing activities that stretch the skin. Stretching the skin could lead to bleeding or a bigger scar. Don't soak in a bathtub, swimming pool or hot tub until your health care provider says it's OK -- usually about seven days after the procedure.  If there is persisting noticeable redness, swelling, pain and/or pus after 3 to 4 days, you may have an infection. Please don?t hesitate to call us.      [facility]  [phone]    [facility]  [phone]    After Hours:  Please call the page operator and ask for the Dermatologist on call: [phone]     WOUND CARE INSTRUCTIONS:  Cryotherapy (liquid nitrogen) was performed to treat actinic keratoses (sun-damaged, pre-cancerous skin).  Expect redness and irritation of the treated sites over the next several days.  The sites may scab and/or blister.  Apply plain petrolatum (Vaseline) or Aquaphor to the sites after gentle cleansing daily to twice daily to assist in healing.  Please contact the clinic should you have any questions or concerns.     IMPORTANT  Information on Using MyChart Message Portal:  Please carefully read the following MyChart message guidance for patients. This guidance is to ensure proper evaluation and quality care of our patients.  MyChart messages should be used only for NON-URGENT and brief questions related to two matters: (1) problems with obtaining your medications that were prescribed at your last visit. For example, you were not able to obtain the medication; pharmacy did not have the medication; insurance did not cover the medication. (2) problems related to surgical procedures performed (example: biopsies) at the last visit, such as unexpected bleeding, etc.     For all other issues, please kindly call the clinic for an in-person or telemedicine (online) appointment [facility] Telephone: [phone] or  [facility] Telephone: [phone]). This is to ensure that we will conduct a proper examination and evaluation of your skin problems. Please kindly do not send pictures of skin rashes or spots via MyChart message for evaluation because we will not be able to properly evaluate  them. Please also kindly do not send questions relating to new skin problems because we will need an appointment to properly evaluate these concerns. If your question is urgent, when you call our clinic to make an appointment, please request an urgent appointment with the provider. We will do our best to accommodate you. However, if we cannot accommodate you on a particular provider's schedule, please be prepared to possibly see another provider.     If you still would like to send questions via MyChart that are not directly related to obtaining medication or post-procedure problems, please note that your insurance may be billed for the time and effort your provider takes to respond to this message.  Your insurance may not cover these expenses. You will be responsible for all charges and expenses related to MyChart communications that are not covered by your insurance. It is your responsibility to check with your insurance company whether this is covered and to determine your out-of-pocket expenses.     For prescription refills: In general, we will provide enough refills until your next appointment with [place]. Thus, please first check with your pharmacy first to see if there are any refills left on your prescription. If you have not been seen for longer than one year, you will need to call the clinic to schedule an appointment before we can refill your medications.     Prior authorizations: Based on your insurance, some medications require prior authorizations. You may need to contact your insurance company regarding details of prior authorization. You can also contact our clinic ([phone]) if you have not heard about the approval status of your medication after two weeks of visiting your provider.     Medications that are not covered by insurance: For medications that are not covered by your insurance, one good resource to check out to reduce your out-of-pocket cost is GoodRx. Please check out goodrx (goodrx.com or download the app).

## 2023-12-05 NOTE — Progress Notes
 [facility] - Division of Dermatology    Patient: Calvin Chen  MRN: [id number]  DOB: 1956/08/07   Date: 12/05/2023  Referral From: Ruffus Couch, MD  Primary Care Provider: Ruffus Couch, MD    Chief Complaint   Patient presents with    General Skin Check     Subjective:  Calvin Chen is a 68 y.o. male who presents for a skin check:    LV [birth year] [facility]    The patient is here for a full skin exam.    The following are areas of concern for the patient:  Location: Left hand 3rd finger  Duration: Few years (was discussed last visit)  Symptoms: None    History of BCC on the nose  Family History of Skin Cancer: None    Other concerns:   He has a flaky area on the left arm   He thinks it happened ever since his first COVID vaccine  Not itchy today    (click to expand/collapse)    Past Medical History:   Diagnosis Date    Atrial fibrillation (HCC/RAF)     Chicken pox 1965    Diabetes mellitus (HCC/RAF) 10/24/2023    GERD (gastroesophageal reflux disease)     Hyperlipidemia 2009    Hypertension 2009    Kidney stone 2018    Seasonal allergies 1969    Skin cancer 2010    Sleep apnea 2014    Use APAP     Past Surgical History:   Procedure Laterality Date    CARDIAC ELECTROPHYSIOLOGY MAPPING AND ABLATION      afib    COLONOSCOPY       Outpatient Medications Prior to Visit   Medication Sig    atorvastatin 40 mg tablet Take 1 tablet (40 mg total) by mouth daily.    LISINOPRIL-HYDROCHLOROTHIAZIDE 20-25 mg tablet TAKE 2 TABLETS DAILY    metFORMIN 500 mg tablet Take 1 tablet (500 mg total) by mouth two (2) times daily.    metoprolol tartrate 50 mg tablet Take 1 tablet (50 mg total) by mouth two (2) times daily.    RYBELSUS 3 MG TABS     atovaquone-proguanil 250-100 mg tablet Take 1 tablet by mouth daily. (Patient not taking: Reported on 12/05/2023)    clindamycin 1% gel apply thin layer topically to affected area once daily (Patient not taking: Reported on 12/05/2023)    ketoconazole 2% cream apply to affected area once daily FOR 6 WEEKS (Patient not taking: Reported on 12/05/2023)    rivaroxaban (XARELTO) 20 mg tablet Take 1 tablet (20 mg total) by mouth daily with dinner Please make appointment for follow up. (Patient not taking: Reported on 12/05/2023)    typhoid vaccine (VIVOTIF) capsule Take 1 capsule by mouth every other day. (Patient not taking: Reported on 12/05/2023)     No facility-administered medications prior to visit.     Allergies   Allergen Reactions    Nsaids Rash and Hives    Sulfa Antibiotics Rash     Bactrim  Diffuse maculopapular rash, fever    Naproxen Rash     Family History   Problem Relation Age of Onset    Diabetes Mother     Heart disease Mother     Diabetes Father     Other (mitral valve disease) Brother     Anesthesia problems Neg Hx     Malignant hyperthermia Neg Hx        Review of Systems:  Constitutional: Negative for fevers, chills, and  night sweats  Skin:  Otherwise negative    Physical Exam:  General: Pleasant, alert, cooperative  Psychiatric: Normal mood and affect     Skin type: II  Skin examined on scalp, head, eyes, lips, neck, chest, back, abdomen, right upper extremity, right hand, left upper extremity, left hand, right lower extremity, right foot, left lower extremity, left foot, and digits/nails.    Left Flank  1 cm asymmetric reddish-brown macule    Left Eyebrow, Left Nasal Sidewall, Right Forehead, Right Temple  - Gritty papule  Right Axilla (2), Right Upper Arm - Anterior  - Well-demarcated, waxy appearing hyperkeratotic papule with crust and punctate hemorrhage under dermoscopic examination  Abdomen (Lower Torso, Anterior), Chest (Upper Torso, Anterior), Torso - Posterior (Back)  - Well-demarcated, waxy appearing hyperkeratotic papules with milia-like cysts and comedo-like openings when examined under dermoscopy  Left Upper Arm - Posterior  Erythematous scaly plaque    Left Dorsal Mid 3rd Finger  Firm skin colored papule  A chaperone was offered for the physical exam, and the patient declined Assessment and Plan:    NEOPLASM OF UNSPECIFIED BEHAVIOR OF BONE, SOFT TISSUE, AND SKIN  Left Flank  Skin Biopsy  Type of biopsy: tangential    Informed consent: discussed and consent obtained    Informed consent comment:  Risks of scarring, bleeding, and infection reviewed.  Timeout: patient name, date of birth, surgical site, and procedure verified    Procedure prep:  Patient was prepped and draped in usual sterile fashion  Prep type:  Isopropyl alcohol  Anesthesia: the lesion was anesthetized in a standard fashion    Anesthetic:  1% lidocaine  w/ epinephrine  1-100,000 local infiltration  Instrument used: DermaBlade    Hemostasis achieved with: pressure and aluminum chloride    Outcome: patient tolerated procedure well    Post-procedure details: sterile dressing applied and wound care instructions given    Dressing type: petrolatum and pressure dressing      lidocaine -EPINEPHrine  1 %-1:100000 inj 1 mL    Specimen 1 - Tissue Exam  Differential Diagnosis: nevus, r/o atypia    Check Margins: No  - Presumed diagnosis was explained to patient and biopsy was recommended for diagnostic purposes  - Discussed if skin cancer is found, additional therapy may be needed  ACTINIC KERATOSIS (4)  Left Eyebrow, Left Nasal Sidewall, Right Forehead, Right Temple  - Discussed premalignant nature of lesions and relation to actinic damage  - Discussed diagnostic and treatment options with recommendation for cryotherapy and possible need for multiple rounds of cryotherapy  - Recommend follow-up if lesion(s) fail to fully resolve  - Counseled on sun protection/avoidance, sunscreen SPF >30  Destruction of Lesion - Left Eyebrow, Left Nasal Sidewall, Right Forehead, Right Temple  Complexity: simple    Destruction method: cryotherapy    Informed consent: discussed and consent obtained    Informed consent comment:  Risks, benefits, and alternatives were discussed including, but not limited to, pain, blistering, scarring, incomplete lesion removal, lesion recurrence, and pigment change. If lesion persists, counseled to return to clinic for re-treatment or biopsy.  Lesion destroyed using liquid nitrogen: Yes    Region frozen until ice ball extended beyond lesion: Yes    Cryotherapy cycles:  2  Outcome: patient tolerated procedure well with no complications    Post-procedure details: wound care instructions given    INFLAMED SEBORRHEIC KERATOSIS (3)  Right Axilla (2), Right Upper Arm - Anterior  - Discussed diagnostic and treatment options with recommendation for cryotherapy and possible need  for multiple rounds of cryotherapy  - Recommend follow-up if lesion(s) fail to fully resolve  Destruction of Lesion - Right Axilla (2), Right Upper Arm - Anterior  Complexity: simple    Destruction method: cryotherapy    Informed consent: discussed and consent obtained    Informed consent comment:  Risks, benefits, and alternatives were discussed including, but not limited to, pain, blistering, scarring, incomplete lesion removal, lesion recurrence, and pigment change. If lesion persists, counseled to return to clinic for re-treatment or biopsy.  Lesion destroyed using liquid nitrogen: Yes    Region frozen until ice ball extended beyond lesion: Yes    Cryotherapy cycles:  2  Outcome: patient tolerated procedure well with no complications    Post-procedure details: wound care instructions given    SEBORRHEIC KERATOSES (3)  Abdomen (Lower Torso, Anterior), Chest (Upper Torso, Anterior), Torso - Posterior (Back)  - Discussed likely diagnosis with patient  - Counseled patient to return to clinic sooner for any new or changing skin lesions or skin concerns  DERMATITIS  Left Upper Arm - Posterior  Query if eczematous dermatitis vs non-healing ulcer   - Discussed I recommend to biopsy (to r/o bcc, scc), given biopsy on the abdomen, patient defers biopsy given multiple procedures today and he wants to see how abdomen heals  MUCOUS CYST OF DIGIT OF HAND  Left Dorsal Mid 3rd Finger  - Discussed likely diagnosis with patient  - Discussed only treatment is surgical removal  - Counseled patient to return to clinic sooner for any new or changing skin lesions or skin concerns      The above plan of care, diagnosis, orders, and follow-up were discussed with the patient and/or guardian(s). Questions related to this recommended plan of care were answered.    Follow-up: 6 months or sooner as needed    Zaine Elsass, MD  Division of Dermatology  [facility]    CC: Ruffus Couch, MD

## 2023-12-06 MED ADMIN — LIDOCAINE-EPINEPHRINE 1 %-1:100000 IJ SOLN: 1 mL | INTRADERMAL | Stop: 2023-12-05

## 2023-12-11 LAB — Tissue Exam

## 2023-12-13 ENCOUNTER — Telehealth: Payer: BLUE CROSS/BLUE SHIELD

## 2023-12-13 NOTE — Telephone Encounter
 Called to discuss results of biopsy  Moderate AN    He states it is healing well, no issues

## 2023-12-19 ENCOUNTER — Ambulatory Visit: Payer: BLUE CROSS/BLUE SHIELD

## 2023-12-19 DIAGNOSIS — E66811 Class 1 obesity with serious comorbidity and body mass index (BMI) of 33.0 to 33.9 in adult, unspecified obesity type: Secondary | ICD-10-CM

## 2023-12-19 DIAGNOSIS — I480 Paroxysmal atrial fibrillation: Secondary | ICD-10-CM

## 2023-12-19 DIAGNOSIS — Z6833 Body mass index (BMI) 33.0-33.9, adult: Secondary | ICD-10-CM

## 2023-12-19 DIAGNOSIS — I10 Essential (primary) hypertension: Secondary | ICD-10-CM

## 2023-12-19 NOTE — Patient Instructions
 SLEEP STUDY AND PAP PRESCRIPTION GUIDE    Step 1: Physician Consult  Prescribed Sleep Study.    Step 2: [place] Sleep Study  An authorization to have the sleep study conducted may take up to 1-2 weeks to process.  When the referral status is AUTHORIZED, please call the  Sleep Lab at [phone] to schedule your sleep study or home sleep apnea test kit pick up  If you have difficulty scheduling the sleep study, please notify the physician that ordered the sleep study      Step 3: Sleep Study Interpreted by Physician  It may take 1-2 weeks for the sleep study to be scored, interpreted, and reported to the referring physician.  If the patient is started on PAP during the Sleep Study, a prescription may be submitted for PAP equipment.    Step 4: Positive Airway Pressure (PAP) Prescription  The Sleep coordinator, will contact the patient with the details specific to their case.    Step 5: Durable Medical Equipment Company (DME)  After receiving the PAP prescription from the physician, the DME will contact the patient for setup. This may take 3-6 weeks for PPO & HMO and 60-90 days for Medi-cal & Medicare patients to complete authorization, processing and set up due to insurance guidelines.  If any issues arise, contact the Sleep coordinator at [phone].    Step 6: Positive Airway Pressure (PAP) Management   After receiving the PAP equipment, call to schedule a 2 week appointment for PAP management; start using equipment ASAP. You have only 30 days from receiving the equipment to exchange the mask. It is important to use the equipment 1-2 weeks prior to PAP management.  To schedule the appointment for PAP Management, call [phone].  Initial appointments are 1 hour and follow up appointments last 30 minutes.   Bring ALL PAP equipment including tubing to the PAP Management appointment  If any problems occurs with your PAP, call to schedule a PAP Management appointment.  ** Please note that non-compliance may result in discontinued PAP supplies **     Step 7: Follow up visit  Schedule a follow up visit with physician to discuss the Sleep Study results and PAP therapy.  Follow up visit with physician within 31-90 days of starting PAP treatment is required for insurance compliance.

## 2023-12-19 NOTE — Consults
 PULMONARY/SLEEP MEDICINE CONSULTATION    PCP: Ruffus Couch, MD      Chief Complaint   Patient presents with    New Patient         DIAGNOSTIC SLEEP TEST(s):  N/A    PULMONARY FUNCTION TEST(s):  N/A    CARDIODIAGNOSTIC EXAM(s):  N/A    IMAGING(s):  CXR 11/2020:  IMPRESSION:       Normal cardiomediastinal silhouette.  Normal lung volumes. No consolidation. Mild pulmonary vascular congestion.  No pleural effusions. No pneumothorax.   No acute osseous abnormalities.    Chief Sleep Complaint: OSA on PAP    HPI:  Calvin Chen is 68 y.o. male  has a past medical history of Atrial fibrillation (HCC/RAF), Chicken pox (1965), Diabetes mellitus (HCC/RAF) (10/24/2023), GERD (gastroesophageal reflux disease), Hyperlipidemia (2009), Hypertension (2009), Kidney stone (2018), Seasonal allergies (1969), Skin cancer (2010), and Sleep apnea (2014). presents today for consult. Referred by No ref. provider found    He was diagnosed with OSA, he is not sure how long ago but probably 10 years ago. He used to wake up every hour, feeling sleepy during the day, witnessed apnea and snoring. He bas been using PAP nightly. Did not have issues with PAP until recently as it is failing (giving him alarm that it is failing). He bought a travel PAP.  Travels ''frequent enough''.  With PAP he is sleeping through the night and feeling more energetic.  He is using FFM. He has not tried in the past nasal mask.    Works as: Pensions consultant  Relevant Familial Medical History:  Older brother had OSA  Mother had heart disease, Older brother heart disease      Usual bedtime 10-11 PM, falls asleep within 30 minutes.  Once asleep, the patient reports sleep fragmentation 1-2 times/night due to  go to the restroom  Wake-up time 6-7 AM, total time in bed: 8 hours  Reports taking 1 daytime naps/week for 1-2 hrs in the late afternoon   On the weekends or off work days, patient reports a similar sleep schedule as noted above with little deviation    Patient and bed partner report: No irregular breathing during sleep  Patient complains of: daytime sleepiness which adversely affects performance of daily activities and quality of life and daytime fatigue  Reports morning headache: No   Reports wakening with dry mouth: Yes: from the CPAP  Reports sleepiness with driving: No   Reports motor vehicle collisions due to drowsy driving: No  Reports daily caffeine consumption: Yes: 2 cups , 1 in am and the other after lunch    Epworth Scale Score (ESS): 14     Reports the urge to move the legs or irritating feeling in the legs that prevents from falling asleep: No  History of abnormal behaviors during sleep: Yes: before using PAP    Reports symptoms suggestive of narcolepsy:   Sleep paralysis:  No  Cataplexy:  No  Hypnagogic or hypnopompic hallucinations: Yes: when falling asleep    Reports allergic rhinitis symptoms: nasal congestion, post-nasal drip, rhinorrhea, sneezing, throat clearing cough, and itchy eyes, seasonal  Reports symptoms suggestive of gastroesophageal reflux: No    Reports tobacco use: No  Reports daily alcohol consumption: Yes: 4-5 onz of wine with dinner 6-7 pm    Past Medical History:   Diagnosis Date    Atrial fibrillation (HCC/RAF)     Chicken pox 1965    Diabetes mellitus (HCC/RAF) 10/24/2023    GERD (gastroesophageal reflux disease)  Hyperlipidemia 2009    Hypertension 2009    Kidney stone 2018    Seasonal allergies 1969    Skin cancer 2010    Sleep apnea 2014    Use APAP         Past Surgical History:   Procedure Laterality Date    CARDIAC ELECTROPHYSIOLOGY MAPPING AND ABLATION      afib    COLONOSCOPY           Social History     Tobacco Use    Smoking status: Never    Smokeless tobacco: Never   Vaping Use    Vaping status: Never Used   Substance Use Topics    Alcohol use: Yes     Alcohol/week: 2.4 - 4.2 oz     Types: 4 - 7 Glasses of Wine (5 oz) per week     Comment: 0.5-1 glass daily    Drug use: No         Family History   Problem Relation Age of Onset    Diabetes Mother     Heart disease Mother     Diabetes Father     Other (mitral valve disease) Brother     Anesthesia problems Neg Hx     Malignant hyperthermia Neg Hx          Allergies   Allergen Reactions    Nsaids Rash and Hives    Sulfa Antibiotics Rash     Bactrim  Diffuse maculopapular rash, fever    Naproxen Rash         Medications that the patient states to be currently taking   Medication Sig    atorvastatin 40 mg tablet Take 1 tablet (40 mg total) by mouth daily.    atovaquone-proguanil 250-100 mg tablet Take 1 tablet by mouth daily.    clindamycin 1% gel     ketoconazole 2% cream     LISINOPRIL-HYDROCHLOROTHIAZIDE 20-25 mg tablet TAKE 2 TABLETS DAILY    metFORMIN 500 mg tablet Take 1 tablet (500 mg total) by mouth two (2) times daily.    metoprolol tartrate 50 mg tablet Take 1 tablet (50 mg total) by mouth two (2) times daily.    rivaroxaban (XARELTO) 20 mg tablet Take 1 tablet (20 mg total) by mouth daily with dinner Please make appointment for follow up.    RYBELSUS 3 MG TABS     typhoid vaccine (VIVOTIF) capsule Take 1 capsule by mouth every other day.       ROS: pertinent review of systems as detailed in the HPI. The rest of the complete review of systems is negative except as detailed in the written health and history questionnaire.    PHYSICAL EXAM:  BP 136/74  ~ Pulse 61  ~ Temp 36.4 ?C (97.6 ?F) (Forehead)  ~ Wt 215 lb 3.2 oz (97.6 kg)  ~ SpO2 96%  ~ BMI 34.73 kg/m?   General: Well-developed male, in no acute distress.   HEENT: Normocephalic and atraumatic. Pupils are equal, round, and reactive to light. Extraocular movements intact. Nares: Mild bilateral turbinate edema. No polyps, examined. Oropharynx: Lips, gums, buccal mucosa, dentition normal, Mallampati class IV airway. No exudates or lesions.   Neck: Supple.   Respiratory/Chest: Respirations non-labored. No accessory muscle use.   Neuro: Alert, oriented and appropriately interactive. No focal deficits. Speech fluent.   Psychological: normal affect    LABS:  Lab Results   Component Value Date    WBC 9.75 10/24/2023    HGB  14.2 10/24/2023    HCT 43.3 10/24/2023    MCV 83.6 10/24/2023    PLT 332 10/24/2023     .RESULAST[NA,K,CL,CO2,BUN,CREAT,CALCIUM,GLUCOSE,TOTPRO,ALBUMIN,BILITOT,ALKPHOS,AST,ALT    Lab Results   Component Value Date    TSH 1.1 10/24/2023     Hgb A1c   Date Value Ref Range Status   10/24/2023 7.7 (H) <5.7 % HbA1c Final     Comment:     For patients with diabetes, an A1c less than (<) or equal (=) to 7.0% is recommended for most patients, however the goal may be higher or lower depending on age and/or other medical problems.     For a diagnosis of diabetes, A1c greater than (>) or equal(=) to 6.5% indicates diabetes; values between 5.7% and 6.4% may indicate an increased risk of developing diabetes.    Hemoglobin A1c is determined with Roche Cobas immunoassay. The presence of abnormal hemoglobin variants, Thalassemia, and increased concentration of HbF (>7%), may result in lower HbA1c values.   12/24/2019 6.3 (H) <5.7 % Final     Comment:     For patients with diabetes, an A1c less than (<) or equal (=) to 7.0% is recommended for most patients, however the goal may be higher or lower depending on age and/or other medical problems.   For a diagnosis of diabetes, A1c greater than (>) or equal(=) to 6.5% indicates diabetes; values between 5.7% and 6.4% may indicate an increased risk of developing diabetes.   06/15/2019 6.3 (H) <5.7 % Final     Comment:     For patients with diabetes, an A1c less than (<) or equal (=) to 7.0% is recommended for most patients, however the goal may be higher or lower depending on age and/or other medical problems.   For a diagnosis of diabetes, A1c greater than (>) or equal(=) to 6.5% indicates diabetes; values between 5.7% and 6.4% may indicate an increased risk of developing diabetes.      No results found for: ''FERRITIN''      COMPLIANCE DATA:  N/A    ASSESSMENT: 68 y.o. male  has a past medical history of Atrial fibrillation (HCC/RAF), Chicken pox (1965), Diabetes mellitus (HCC/RAF) (10/24/2023), GERD (gastroesophageal reflux disease), Hyperlipidemia (2009), Hypertension (2009), Kidney stone (2018), Seasonal allergies (1969), Skin cancer (2010), and Sleep apnea (2014). with The primary encounter diagnosis was Paroxysmal atrial fibrillation (HCC/RAF). Diagnoses of Primary hypertension, OSA on CPAP, and Class 1 obesity with serious comorbidity and body mass index (BMI) of 33.0 to 33.9 in adult, unspecified obesity type were also pertinent to this visit. The patient is establishing care for previously diagnosed OSA on CPAP    PLAN:    Suspected sleep disordered breathing:  We discussed common sleep disorders that may result in sleep fragmentation and non restorative sleep, specifically sleep disordered breathing as well as sleep-related movement disorders  We discussed the pathophysiology of obstructive sleep apnea (OSA). We discussed health risks associated with untreated obstructive sleep apnea including hypertension, refractory hypertension, coronary artery disease, heart failure, cardiac dysrhythmia, stroke, insulin resistance as well as the potential to exacerbate underlying mood disorders.  We reviewed treatment options for obstructive sleep apnea including: weight loss, positional therapy (elevate head of bed, maintain sleep in lateral positions), surgery, oral appliance therapy, upper airway cranial nerve XII (hypoglossal) stimulator (Inspire device) treatment, and positive airway pressure (PAP) therapy.  Proceed with home sleep apnea test (HSAT).  If the diagnostic test is positive for obstructive sleep apnea, the patient agrees to continue with  PAP  therapy and will participate in PAP initiation and management (PIM) program with follow up with respiratory therapist in clinic.  If sleep study demonstrates AHI between 5 and 14, treatment warranted based on patient's comorbidities: excessive daytime sleepiness, hypertension.      Circadian rhythm disruption:  We discussed the factors that influence circadian rhythms, including light, temperature and melatonin.  We discussed optimal sleep hygiene practices including maintaining a set schedule, consistent light exposure in the morning hours, minimizing alerting activities prior to bedtime, avoiding spending excessive time in bed while awake, and consistent wake up time.  We discussed the close relationship between mood disorders and sleep disorders and that effective management of insomnia will require attention to both.      DME provider: pending        I discussed the plan in detail with the patient who voiced understanding and is in agreement.    ~ 40 minutes was spent on this visit.    Follow up: After sleep study.        ATTESTATION:    Phillip Breach, MD  Internal Medicine  [facility] Sleep Medicine Fellow

## 2024-01-03 ENCOUNTER — Telehealth: Payer: BLUE CROSS/BLUE SHIELD

## 2024-01-03 ENCOUNTER — Other Ambulatory Visit: Payer: PRIVATE HEALTH INSURANCE

## 2024-01-03 ENCOUNTER — Inpatient Hospital Stay: Payer: BLUE CROSS/BLUE SHIELD

## 2024-01-03 NOTE — Telephone Encounter
 Call Back Request      Reason for call back: Patient is returning miss call from office. He's unsure what it's about. Please assist, thank you.    Any Symptoms:  []  Yes  [x]  No      If yes, what symptoms are you experiencing:    Duration of symptoms (how long):    Have you taken medication for symptoms (OTC or Rx):      If call was taken outside of clinic hours:    [] Patient or caller has been notified that this message was sent outside of normal clinic hours.     [] Patient or caller has been warm transferred to the physician's answering service. If applicable, patient or caller informed to please call us  back if symptoms progress.  Patient or caller has been notified of the turnaround time of 1-2 business day(s).

## 2024-01-03 NOTE — Telephone Encounter
 Attempted to call patient, but the call went to voicemail and the mailbox was full. Sent a MyChart message notifying pt they aren't cleared for 8am echo. Advised them to call us , so we can r/s appt.

## 2024-01-07 ENCOUNTER — Telehealth: Payer: BLUE CROSS/BLUE SHIELD

## 2024-01-07 ENCOUNTER — Ambulatory Visit: Payer: PRIVATE HEALTH INSURANCE

## 2024-01-07 NOTE — Telephone Encounter
 Patient's Calvin Chen is pending for today visit with Dr. Venice Gillis. I spoke to the patient and he did not want to r/s at this time. Patient is trying to get his [employer] [facility] assigned back to [employer] since he is with [employer] right now. Once pt has the med group changed, he will need to obtain a new auth so it will be some time before the patient schedules the follow up appt.

## 2024-02-03 ENCOUNTER — Ambulatory Visit: Payer: PRIVATE HEALTH INSURANCE

## 2024-02-12 ENCOUNTER — Other Ambulatory Visit: Payer: PRIVATE HEALTH INSURANCE

## 2024-03-11 NOTE — Consults
 COLORECTAL SURGERY OUTPATIENT CONSULTATION    DATE OF SERVICE:  03/13/2024    REFERRING PHYSICIAN:  Dr. Viviane Fusi   REASON FOR CONSULTATION:   Hemorrhoids     HISTORY OF PRESENT ILLNESS:   The patient Calvin Chen is a 68 y.o. year-old Chen PMH a fib, DM, GERD, HLD, HTN, nephrolithiasis, skin cancer, sleep apnea, presents to [facility] for initial evaluation of hemorrhoids.     Colorectal clinic initial visit 03/13/2024:    Referred by PCP for hemorrhoids.       No questionnaires on file.   PAST MEDICAL HISTORY:     Past Medical History:   Diagnosis Date    Atrial fibrillation (HCC/RAF)     Chicken pox 1965    Diabetes mellitus (HCC/RAF) 10/24/2023    GERD (gastroesophageal reflux disease)     Hyperlipidemia 2009    Hypertension 2009    Kidney stone 2018    Seasonal allergies 1969    Skin cancer 2010    Sleep apnea 2014    Use APAP       PAST SURGICAL HISTORY:     Past Surgical History:   Procedure Laterality Date    CARDIAC ELECTROPHYSIOLOGY MAPPING AND ABLATION      afib    COLONOSCOPY         CURRENT MEDICATIONS:     Current Outpatient Medications   Medication Sig    atorvastatin  40 mg tablet Take 1 tablet (40 mg total) by mouth daily.    atovaquone -proguanil 250-100 mg tablet Take 1 tablet by mouth daily.    clindamycin 1% gel     ketoconazole 2% cream     LISINOPRIL -HYDROCHLOROTHIAZIDE  20-25 mg tablet TAKE 2 TABLETS DAILY    metFORMIN 500 mg tablet Take 1 tablet (500 mg total) by mouth two (2) times daily.    metoprolol  tartrate 50 mg tablet Take 1 tablet (50 mg total) by mouth two (2) times daily.    rivaroxaban  (XARELTO ) 20 mg tablet Take 1 tablet (20 mg total) by mouth daily with dinner Please make appointment for follow up.    RYBELSUS  3 MG TABS     typhoid vaccine (VIVOTIF ) capsule Take 1 capsule by mouth every other day.     No current facility-administered medications for this visit.       ALLERGIES:     Allergies   Allergen Reactions    Nsaids Rash and Hives    Sulfa Antibiotics Rash     Bactrim  Diffuse maculopapular rash, fever    Naproxen Rash       FAMILY HISTORY:     Family History   Problem Relation Age of Onset    Diabetes Mother     Heart disease Mother     Diabetes Father     Other (mitral valve disease) Brother     Anesthesia problems Neg Hx     Malignant hyperthermia Neg Hx        SOCIAL HISTORY:     Social History     Tobacco Use    Smoking status: Never    Smokeless tobacco: Never   Vaping Use    Vaping status: Never Used   Substance Use Topics    Alcohol use: Yes     Alcohol/week: 2.4 - 4.2 oz     Types: 4 - 7 Glasses of Wine (5 oz) per week     Comment: 0.5-1 glass daily    Drug use: No       REVIEW  OF SYSTEMS:    A 14-point review of systems was performed.   PHYSICAL EXAM:   There were no vitals filed for this visit.  General: A WD/WN Chen in NAD.    Neurologic: Alert and oriented x 4 with no focal neurologic abnormalities. Mood and affect are normal.   AnoRectal: Perianal skin is ***. Sphincter tone is ***. On digital rectal exam, there are no palpable lesions in the anal canal or distal rectum.  ANOSCOPY PROCEDURE: With the patient in the left lateral decubitus position, the anoscope was introduced into the anus and the anal canal and distal rectum inspected.  The anoscope was subsequently withdrawn without complication.  The findings are as follows: ***    IMAGING:   None recent.     INVASIVE STUDIES/ENDOSCOPY:           ASSESSMENT:   Calvin Chen PMH a fib, DM, GERD, HLD, HTN, nephrolithiasis, skin cancer, sleep apnea, presents to [facility] clinic for initial evaluation of hemorrhoids.     RECOMMENDATIONS:   ***    Plan of care discussed and the patient verbalized understanding of all of the above.

## 2024-03-13 ENCOUNTER — Ambulatory Visit: Payer: PRIVATE HEALTH INSURANCE

## 2024-04-02 MED ORDER — SEMAGLUTIDE 3 MG PO TABS
3 mg | ORAL_TABLET | Freq: Every day | ORAL | 0 refills | 28.00000 days | Status: AC
Start: 2024-04-02 — End: 2024-04-16

## 2024-04-15 ENCOUNTER — Ambulatory Visit: Payer: PRIVATE HEALTH INSURANCE

## 2024-04-15 DIAGNOSIS — Z6833 Body mass index (BMI) 33.0-33.9, adult: Secondary | ICD-10-CM

## 2024-04-15 DIAGNOSIS — N400 Enlarged prostate without lower urinary tract symptoms: Secondary | ICD-10-CM

## 2024-04-15 DIAGNOSIS — L989 Disorder of the skin and subcutaneous tissue, unspecified: Secondary | ICD-10-CM

## 2024-04-15 DIAGNOSIS — R350 Frequency of micturition: Secondary | ICD-10-CM

## 2024-04-15 DIAGNOSIS — K649 Unspecified hemorrhoids: Secondary | ICD-10-CM

## 2024-04-15 DIAGNOSIS — E1169 Type 2 diabetes mellitus with other specified complication: Secondary | ICD-10-CM

## 2024-04-15 DIAGNOSIS — Z23 Encounter for immunization: Secondary | ICD-10-CM

## 2024-04-15 DIAGNOSIS — E66811 Class 1 obesity with serious comorbidity and body mass index (BMI) of 33.0 to 33.9 in adult, unspecified obesity type: Secondary | ICD-10-CM

## 2024-04-15 DIAGNOSIS — I480 Paroxysmal atrial fibrillation: Secondary | ICD-10-CM

## 2024-04-15 DIAGNOSIS — E785 Hyperlipidemia, unspecified: Secondary | ICD-10-CM

## 2024-04-15 DIAGNOSIS — I152 Hypertension secondary to endocrine disorders: Secondary | ICD-10-CM

## 2024-04-15 DIAGNOSIS — E119 Type 2 diabetes mellitus without complications: Principal | ICD-10-CM

## 2024-04-15 DIAGNOSIS — E1159 Type 2 diabetes mellitus with other circulatory complications: Secondary | ICD-10-CM

## 2024-04-15 MED ORDER — ATORVASTATIN CALCIUM 40 MG PO TABS
40 mg | ORAL_TABLET | Freq: Every day | ORAL | 3 refills | 90.00000 days | Status: AC
Start: 2024-04-15 — End: ?

## 2024-04-15 MED ORDER — SEMAGLUTIDE 7 MG PO TABS
7 mg | ORAL_TABLET | Freq: Every day | ORAL | 0 refills | 28.00000 days | Status: AC
Start: 2024-04-15 — End: ?

## 2024-04-15 MED ORDER — METOPROLOL TARTRATE 50 MG PO TABS
50 mg | ORAL_TABLET | Freq: Two times a day (BID) | ORAL | 3 refills | 60.00000 days | Status: AC
Start: 2024-04-15 — End: ?

## 2024-04-15 NOTE — Progress Notes
 [place] [facility]     Primary Care Progress Note        Patient:  Calvin Chen  Medical record number:  [id number]  Date of birth:  [birth year]  Date of service:  04/15/2024    Cc: follow up dm     BP 169/113  ~ Pulse 77  ~ Temp 36.7 ?C (98.1 ?F) (Tympanic)  ~ Wt 216 lb (98 kg)  ~ BMI 34.86 kg/m?     SUBJECTIVE:     Calvin Chen is a 68 y.o. male who presents for :    1. Type 2 dm : taking rybelsus  3mg  and metformin 500mg  bid   Does not want injections   Taking rybelsus  3mg    No side effects   Still has a good appetite   Does not take it every day     Diet: eating less  Exercise: walks 6 days per week, bike ride once a week     [facility]:    HBA1C 7.8 (A) 08/23/2023   HBA1C 8.5 (H) 06/20/2023   HBA1C 6.9 (H) 04/12/2022   HBA1C 7.4 (A) 01/11/2022       Lab Results   Component Value Date    HGBA1C 7.7 (H) 10/24/2023         2. Hypertension : did not take meds this morning  Stressed lately   Not checking bp at home     taking lisinopril /hct 20/25mg  2 tablets daily and metoprolol  25mg  bid - was supposed to increase metoprolol  dose but pharmacy gave patient him 25mg  again    3.  Dyslipidemia: was taking atorvastatin  40mg  daily, but pharmacy gave him 20mg  by accident on last prescription   Does forget sometimes     4.  Atrial fibrillation:   Needs new cardiology referral  Taking xarelto  20mg  daily and metoprolol  25mg  bid   supposed to increase metoprolol  dose but pharmacy gave patient him 25mg  again    5.  Skin lesions  Needs derm referral     6.  Sleep apnea:   Using cpap     7.  Hemorrhoids  Needs colorectal referral        Patient Active Problem List   Diagnosis    Atrial fibrillation (HCC/RAF)    Benign prostatic hyperplasia    Erectile dysfunction    HTN (hypertension)    Hyperlipidemia    Long term current use of anticoagulant    OSA on CPAP    Obesity    Urinary tract infectious disease    Hypertensive urgency    Diabetes mellitus (HCC/RAF)    Angiomyolipoma of right kidney    History of colon polyps    Liver cyst    Microalbuminuria due to type 2 diabetes mellitus (HCC/RAF)       Medication:  Medications that the patient states to be currently taking   Medication Sig    atovaquone -proguanil 250-100 mg tablet Take 1 tablet by mouth daily.    clindamycin 1% gel     ketoconazole 2% cream     LISINOPRIL -HYDROCHLOROTHIAZIDE  20-25 mg tablet TAKE 2 TABLETS DAILY    metFORMIN 500 mg tablet Take 1 tablet (500 mg total) by mouth two (2) times daily.    rivaroxaban  (XARELTO ) 20 mg tablet Take 1 tablet (20 mg total) by mouth daily with dinner Please make appointment for follow up.    [DISCONTINUED] atorvastatin  40 mg tablet Take 1 tablet (40 mg total) by mouth daily.    [DISCONTINUED] metoprolol   tartrate 50 mg tablet Take 1 tablet (50 mg total) by mouth two (2) times daily.    [DISCONTINUED] Semaglutide  (RYBELSUS ) 3 MG TABS Take 1 tablet (3 mg total) by mouth daily. Last refill.  Please schedule appointment with Dr. Marcello       Allergies:   Allergies   Allergen Reactions    Nsaids Rash and Hives    Sulfa Antibiotics Rash     Bactrim  Diffuse maculopapular rash, fever    Naproxen Rash       Social History     Tobacco Use    Smoking status: Never    Smokeless tobacco: Never   Substance Use Topics    Alcohol use: Yes     Alcohol/week: 2.4 - 4.2 oz     Types: 4 - 7 Glasses of Wine (5 oz) per week     Comment: 0.5-1 glass daily          Family History   Problem Relation Age of Onset    Diabetes Mother     Heart disease Mother     Diabetes Father     Other (mitral valve disease) Brother     Anesthesia problems Neg Hx     Malignant hyperthermia Neg Hx        Review of Systems: see HPI      PHYSICAL EXAM:     BP 169/113  ~ Pulse 77  ~ Temp 36.7 ?C (98.1 ?F) (Tympanic)  ~ Wt 216 lb (98 kg)  ~ BMI 34.86 kg/m?     BP Readings from Last 3 Encounters:   04/15/24 169/113   12/19/23 136/74   11/05/23 132/78       GEN: WDWN. NAD. Obese   SKIN: Warm, dry, well-perfused. No jaundice.   EYES: Sclera anicteric.  ENT: Hearing intact to voice.   NEURO: Alert, oriented, motor and speech grossly nl.  PSYCH:  Normal affect and behavior.  LUNGS: Clear to auscultation bilaterally, no wheezing, rales, or rhonchi  CV: Regular rate and rhythm, no murmurs, clicks, or rubs    STUDIES:     Relevant labs and imaging were reviewed today by me in conjunction with the patient.    Office Visit on 12/05/2023   Component Date Value Ref Range Status    CASE REPORT 12/05/2023    Final                    Value:Dermatopathology                                  Case: [id number]                                Authorizing Provider:  Galamgam, Jayden M., MD    Collected:           12/05/2023 1444              Ordering Location:     [facility]   Received:            12/05/2023 1848                                     Primary & Specialty Care  Pathologist:           Delice Hacker A., DO                                                        Specimen:    Skin, Left Flank                                                                           CLINICAL INFORMATION 12/05/2023    Final                    Value:Rule out atypical nevus.      FINAL DIAGNOSIS 12/05/2023    Final                    Value:  SKIN, LEFT FLANK (SHAVE BIOPSY):  - Atypical compound lentiginous nevus, extending to the biopsy edges (See Comment)  - The degree of atypia is considered moderate  - No malignancy in the initial and deeper sections examined    COMMENT:  If a larger, clinically suspicious lesion remains, then additional treatment/repeat biopsy may be warranted to rule out an adjacent, higher-grade neoplasm.      MICROSCOPIC DESCRIPTION 12/05/2023    Final                    Value:A microscopic examination has been performed.      [facility] REPORT 12/05/2023    Final                    Value:Tyrosinase/Ki-67 double stain is supportive of the above diagnosis (tyrosinase is positive in lesional melanocytes and Ki-67 index of proliferation is less than 5% in dermal melanocytes). PRAME is positive in rare/focal junctional melanocytes.    Note: IF NOT OTHERWISE DESIGNATED The immunoperoxidase stain reported above was developed and its performance characteristics determined by [facility].  It has not been cleared or approved by the U.S. Food and Drug Administration, although such approval is not required for analyte-specific reagents of this type.  Appropriate controls are included for each case.        GROSS DESCRIPTION 12/05/2023    Final                    Value:The specimen is received in one formalin filled container, labeled with the patient's name Calvin Chen, Calvin Chen), medical record number [id number]), and designated as ?Left Flank?. It consists of a 0.7 x 0.6 x 0.1 cm skin shave biopsy. The epidermis is tan-gray with a brown, ill-defined lesion measuring 0.6 x 0.4 cm.  The specimen is trisected and entirely submitted, in a mesh bag, in cassette A1.     JL 12/09/2023       Signatures 12/05/2023    Final                    Value:I certify that I personally conducted a gross and/or microscopic examination(s) of the described specimen(s), and have reviewed the interpretation of this  test and diagnosis(es).  I agree with the documented findings or edited the findings as necessary.           ASSESSMENT & PLAN:       Diagnoses and all orders for this visit:    Type 2 diabetes mellitus without complication, without long-term current use of insulin (HCC/RAF)  Chronic  Increase rybelsus  to 7mg  daily   -     Semaglutide  (RYBELSUS ) 7 MG TABS; Take 1 tablet (7 mg total) by mouth daily.  -     Basic Metabolic Panel; Future  -     Hgb A1c; Future    Dyslipidemia associated with type 2 diabetes mellitus (HCC/RAF)  Chronic  Taking 20mg  tablets because pharmacy gave them to patient   Patient will increase to 40mg    -     atorvastatin  40 mg tablet; Take 1 tablet (40 mg total) by mouth daily.      Paroxysmal atrial fibrillation (HCC/RAF)  Chronic, stable  Increase metoprolol    -     metoprolol  tartrate 50 mg tablet; Take 1 tablet (50 mg total) by mouth two (2) times daily.    Hypertension associated with diabetes (HCC/RAF)  Elevated bp today  Did not take meds  Start checking bp at home  Increase metoprolol     Benign prostatic hyperplasia, unspecified whether lower urinary tract symptoms present  Urinary frequency  -     Referral to Urology, Adult Urology    Microalbuminuria due to type 2 diabetes mellitus (HCC/RAF)  Chronic, stable     Class 1 obesity with serious comorbidity and body mass index (BMI) of 33.0 to 33.9 in adult, unspecified obesity type  Increase rybelsus   Offered ozempic  - patient does not want to use an injection    Hemorrhoids, unspecified hemorrhoid type  -     Referral to Surgery, Colon & Rectal    Skin lesions  -     Referral to Dermatology    Need for immunization against influenza  -     Influenza vaccine IM; high dose; PF (Fluzone HD) (age 31 years and older)          Return in about 3 months (around 07/16/2024) for diabetes.    Call or return to clinic prn if these symptoms worsen or fail to improve as anticipated.     The above plan was explained and reviewed with the patient.  All questions were answered.     Viviane Fusi, MD  Family Medicine  [facility]

## 2024-04-16 ENCOUNTER — Ambulatory Visit: Payer: BLUE CROSS/BLUE SHIELD

## 2024-04-16 LAB — Hgb A1c: HGB A1C: 7.8 % HbA1c — ABNORMAL HIGH (ref <5.7)

## 2024-04-16 LAB — Basic Metabolic Panel
ESTIMATED GFR 2021 CKD-EPI: 74 mL/min/1.73m2 (ref 7–22)
TOTAL CO2: 26 mmol/L (ref 20–30)

## 2024-04-22 ENCOUNTER — Ambulatory Visit: Payer: PRIVATE HEALTH INSURANCE

## 2024-04-22 DIAGNOSIS — Z131 Encounter for screening for diabetes mellitus: Secondary | ICD-10-CM

## 2024-06-08 ENCOUNTER — Telehealth: Payer: PRIVATE HEALTH INSURANCE

## 2024-06-08 ENCOUNTER — Ambulatory Visit: Payer: PRIVATE HEALTH INSURANCE

## 2024-06-08 NOTE — Telephone Encounter
 Pt is aware of chaperone policy and pt declined for today's appt

## 2024-06-09 ENCOUNTER — Ambulatory Visit: Payer: PRIVATE HEALTH INSURANCE

## 2024-06-09 ENCOUNTER — Telehealth: Payer: PRIVATE HEALTH INSURANCE

## 2024-06-09 DIAGNOSIS — G4730 Sleep apnea, unspecified: Principal | ICD-10-CM

## 2024-06-09 NOTE — Telephone Encounter
 Called and spoke to pt advising him that the clinic does close at 5pm and to see if he is still coming to drop off hst.    Pt states he will not be able to make it by 5pm today because he thought his drop off time was 5:30pm per MyChart.    I advised pt that 5:30p is a defaulted time and that the HST has to be dropped off 7:30am-4:30pm during business hours.    Pt states he was not told 7:30a-4:30p when he picked up HST on 10/20.    I apologized to pt for the miscommunication and pt understood.    Advised pt he may come into the clinic tomorrow 10/22 anytime between 7:30am-5pm to drop off HST. Pt understood.

## 2024-06-10 ENCOUNTER — Ambulatory Visit: Payer: PRIVATE HEALTH INSURANCE

## 2024-06-10 DIAGNOSIS — G4733 Obstructive sleep apnea (adult) (pediatric): Secondary | ICD-10-CM

## 2024-06-10 NOTE — Procedures
 [place] [facility]  SLEEP STUDY REPORT     Patient Name: Calvin Chen Needs BMI: 34.86 kg/m2   Date of Birth: 02/02/1956 Weight: 98 kg   Sex: male    Age: 68 y.o. Type of Study: Home Sleep Apnea Test   MRN: [id number] Referring Physician: Charolette Fredia RAMAN., MD   CSN: [id number] Epworth Sleepiness Scale: 14   Test Date: 06/08/2024 Location: Pulmonary & Sleep Medicine [facility]   ? Epworth Sleepiness Scale (ESS): A score of >= 10 signifies excessive sleepiness.     Main complaint: Possible sleep apnea  Medication:Semaglutide , atorvastatin , atovaquone -proguanil, clindamycin, ketoconazole, lisinopril -hydroCHLOROthiazide , metFORMIN, metoprolol  tartrate, rivaroxaban , and typhoid vaccine     RECORDING:    This home sleep apnea test (HSAT) was performed with a type III HSAT device (ResMed ApneaLink Air Device ID #[id number]) with a nasal pressure transducer. This Type III device records 4 channels from 3 non-invasive sensors which measure respiratory effort, airflow, pulse rate, and oxygen saturation. The evaluation period may be longer than the recording time and analysis exclusion times are deducted when calculating the evaluation period. The patient acknowledged that they received adequate instructions on how to properly apply the sensors to perform the sleep study. This study was scored using 4% desaturation criteria for hypopneas. The interpreting physician has reviewed the referral, clinical history, and raw data.    INTERPRETATION:     This HSAT was diagnostic for severe obstructive sleep apnea with respiratory event index (REI) of 40/hour of evaluation time and minimum oxygen saturation of 69%. Cumulative time with SpO2 <=88% of 48 min.  The evaluation duration was 9 hours and 57 minutes.      DIAGNOSIS:     G47.33 Obstructive sleep apnea and G47.34 Nocturnal hypoxemia    RECOMMENDATION:    Follow up with referring physician to discuss test results and treatment options   Recommend treatment with positive airway pressure (PAP, e.g. CPAP or Auto-PAP). PAP titration polysomnogram may be required to determine optimal settings  Elevating the head of the bed and/or avoiding sleep in the supine position may improve the degree of sleep disordered breathing   Weight loss may improve the degree of sleep disordered breathing    The patient was referred for sleep testing only. Patient should return to the referring physician for clinical interpretation of this sleep study and for discussion of recommendations, follow up and management, including the prescribing of CPAP if appropriate. For referring physicians who are receiving this report who wish to request sleep specialist assistance for interpretation or follow-up, please refer the patient for a formal sleep medicine clinical consultation [Place Order [id number]] or place an eConsult to Sleep Apnea Eval & Management Order [Order [id number]] for peer to peer discussion through [facility].  Patients may call us  to schedule a formal sleep consult [phone]. For referring physicians outside [facility], please call [phone] or [phone] to schedule the patient an appointment for sleep consultation.      Electronically signed by  Fredia Charolette, MD  Director, [facility]    Diplomate, American Board of Sleep Medicine  Diplomate, American Board of Internal Medicine

## 2024-06-11 DIAGNOSIS — G4733 Obstructive sleep apnea (adult) (pediatric): Principal | ICD-10-CM

## 2024-06-11 DIAGNOSIS — G4734 Idiopathic sleep related nonobstructive alveolar hypoventilation: Secondary | ICD-10-CM

## 2024-06-18 ENCOUNTER — Ambulatory Visit: Payer: PRIVATE HEALTH INSURANCE

## 2024-06-18 DIAGNOSIS — G4733 Obstructive sleep apnea (adult) (pediatric): Principal | ICD-10-CM

## 2024-06-29 ENCOUNTER — Other Ambulatory Visit: Payer: PRIVATE HEALTH INSURANCE

## 2024-06-29 ENCOUNTER — Ambulatory Visit: Payer: PRIVATE HEALTH INSURANCE

## 2024-06-29 NOTE — Patient Instructions
PAP PRESCRIPTION GUIDE    .    Step 1: Durable Medical Equipment Company (DME)  After receiving the PAP prescription from the physician, the DME will contact the patient for setup. This may take 3-6 weeks for PPO & HMO and 60-90 days for Medi-cal & Medicare patients to complete authorization, processing and set up due to insurance guidelines.  If any issues arise, contact the Sleep coordinator at [phone].    Step 2: Positive Airway Pressure (PAP) Management   After receiving the PAP equipment, call to schedule a 2 week appointment for PAP management; start using equipment ASAP. You have only 30 days from receiving the equipment to exchange the mask. It is important to use the equipment 1-2 weeks prior to PAP management.  To schedule the appointment for PAP Management, call [phone].  Initial appointments are 1 hour and follow up appointments last 30 minutes.   Bring ALL PAP equipment including tubing to the PAP Management appointment  If any problems occurs with your PAP, call to schedule a PAP Management appointment.  ** Please note that non-compliance may result in discontinued PAP supplies **     Step 3: Follow up visit  Schedule a follow up visit with physician to discuss the Sleep Study results and PAP therapy.  Follow up visit with physician within 31-90 days of starting PAP treatment is required for insurance compliance.

## 2024-06-29 NOTE — Progress Notes
 PULMONARY/SLEEP MEDICINE CONSULTATION    Patient Consent to Telehealth   The patient agreed to participate in the video visit prior to joining the visit.          PCP: Marcello Prayer, MD      No chief complaint on file.        DIAGNOSTIC SLEEP TEST(s):  06/10/2024 HSAT   This HSAT was diagnostic for severe obstructive sleep apnea with respiratory event index (REI) of 40/hour of evaluation time and minimum oxygen saturation of 69%. Cumulative time with SpO2 <=88% of 48 min.  The evaluation duration was 9 hours and 57 minutes.       PULMONARY FUNCTION TEST(s):  N/A    CARDIODIAGNOSTIC EXAM(s):  N/A    IMAGING(s):   11/19/2020 CXR  Normal cardiomediastinal silhouette.  Normal lung volumes. No consolidation. Mild pulmonary vascular congestion.  No pleural effusions. No pneumothorax.   No acute osseous abnormalities.    Chief Sleep Complaint: OSA on PAP    HPI:  Calvin Chen is 68 y.o. male  has a past medical history of Atrial fibrillation (HCC/RAF), Chicken pox (1965), Diabetes mellitus (HCC/RAF) (10/24/2023), GERD (gastroesophageal reflux disease), Hyperlipidemia (2009), Hypertension (2009), Kidney stone (2018), Seasonal allergies (1969), Skin cancer (2010), and Sleep apnea (2014). presents today for consult. Referred by No ref. provider found    He was diagnosed with OSA, he is not sure how long ago but probably 10 years ago. He used to wake up every hour, feeling sleepy during the day, witnessed apnea and snoring. He bas been using PAP nightly. Did not have issues with PAP until recently as it is failing (giving him alarm that it is failing). He bought a travel PAP.  Travels ''frequent enough''.  With PAP he is sleeping through the night and feeling more energetic.  He is using FFM. He has not tried in the past nasal mask.    Works as: pensions consultant  Relevant Familial Medical History: Older brother had OSA  Mother had heart disease, Older brother heart disease     Usual bedtime 10-11 PM, falls asleep within 30 minutes.  Once asleep, the patient reports sleep fragmentation 1-2 times/night due to go to the restroom  Wake-up time 6-7 AM, total time in bed: 8 hours  Reports taking 1 daytime naps/week for 1-2 hrs in the late afternoon   On the weekends or off work days, patient reports a similar sleep schedule as noted above with little deviation    Patient and bed partner report: No irregular breathing during sleep  Patient complains of: daytime sleepiness which adversely affects performance of daily activities and quality of life and daytime fatigue  Reports morning headache: No   Reports wakening with dry mouth: Yes: from the CPAP  Reports sleepiness with driving: No   Reports motor vehicle collisions due to drowsy driving: No  Reports daily caffeine consumption: Yes: 2 cups , 1 in am and the other after lunch    Epworth Scale Score (ESS): 14     Reports the urge to move the legs or irritating feeling in the legs that prevents from falling asleep: No  History of abnormal behaviors during sleep: Yes: before using PAP    Reports symptoms suggestive of narcolepsy:   Sleep paralysis:  No  Cataplexy:  No  Hypnagogic or hypnopompic hallucinations: Yes: when falling asleep    Reports allergic rhinitis symptoms: nasal congestion, post-nasal drip, rhinorrhea, sneezing, throat clearing cough, and itchy eyes, seasonal  Reports symptoms suggestive of gastroesophageal  reflux: No    Reports tobacco use: No  Reports daily alcohol consumption: Yes: 4-5 onz of wine with dinner 6-7 pm      INTERVAL HISTORY:  Calvin Chen is 68 y.o., male with  has a past medical history of Atrial fibrillation (HCC/RAF), Chicken pox (1965), Diabetes mellitus (HCC/RAF) (10/24/2023), GERD (gastroesophageal reflux disease), Hyperlipidemia (2009), Hypertension (2009), Kidney stone (2018), Seasonal allergies (1969), Skin cancer (2010), and Sleep apnea (2014).. Last seen on 12/28/2023, presents today for follow up.    Patient underwent HSAT on 06/10/2024   New PAP device was ordered on 06/19/2024   Sent to [facility]   Patient has not heard back regarding device, recently placed replacement supplies order today   Current machine is well over 78 years old according to patient   Has Airsense 10 machine  Currently using full face mask    Airfit F10 mask   Is pleased with mask   Was previously using airfit f10 mask   Continues taking xarelto  and metoprolol  for a fib   Denies bleeding issues, hematuria  Reports blood in stools with bright blood   Has been evaluated and diagnosed with hemorrhoids   Is managing  Continues taking rybelsus      Past Medical History:   Diagnosis Date    Atrial fibrillation (HCC/RAF)     Chicken pox 1965    Diabetes mellitus (HCC/RAF) 10/24/2023    GERD (gastroesophageal reflux disease)     Hyperlipidemia 2009    Hypertension 2009    Kidney stone 2018    Seasonal allergies 1969    Skin cancer 2010    Sleep apnea 2014    Use APAP         Past Surgical History:   Procedure Laterality Date    CARDIAC ELECTROPHYSIOLOGY MAPPING AND ABLATION      afib    COLONOSCOPY           Social History     Tobacco Use    Smoking status: Never    Smokeless tobacco: Never   Vaping Use    Vaping status: Never Used   Substance Use Topics    Alcohol use: Yes     Alcohol/week: 2.4 - 4.2 oz     Types: 4 - 7 Glasses of Wine (5 oz) per week     Comment: 0.5-1 glass daily    Drug use: No         Family History   Problem Relation Age of Onset    Diabetes Mother     Heart disease Mother     Diabetes Father     Other (mitral valve disease) Brother     Anesthesia problems Neg Hx     Malignant hyperthermia Neg Hx          Allergies   Allergen Reactions    Nsaids Rash and Hives    Sulfa Antibiotics Rash     Bactrim  Diffuse maculopapular rash, fever    Naproxen Rash         No outpatient medications have been marked as taking for the 06/29/24 encounter (Appointment) with [facility], FELLOW.       PHYSICAL EXAM: **limited tele-health visit**  Ht 5' 6'' (1.676 m)  ~ Wt 207 lb (93.9 kg)  ~ BMI 33.41 kg/m?   General: Well-developed male, in no acute distress.   HEENT: Normocephalic and atraumatic. Lips, gums, dentition Normal.   Respiratory/Chest: Non-labored respirations.  Neuro: Alert, oriented and appropriately interactive. Speech fluent.  Psychological: normal affect      LABS:  Lab Results   Component Value Date    WBC 9.75 10/24/2023    HGB 14.2 10/24/2023    HCT 43.3 10/24/2023    MCV 83.6 10/24/2023    PLT 332 10/24/2023     .RESULAST[NA,K,CL,CO2,BUN,CREAT,CALCIUM ,GLUCOSE,TOTPRO,ALBUMIN,BILITOT,ALKPHOS,AST,ALT    Lab Results   Component Value Date    TSH 1.1 10/24/2023     Hgb A1c   Date Value Ref Range Status   04/15/2024 7.8 (H) <5.7 % HbA1c Final     Comment:     For patients with diabetes, an A1c less than (<) or equal (=) to 7.0% is recommended for most patients, however the goal may be higher or lower depending on age and/or other medical problems.     For a diagnosis of diabetes, A1c greater than (>) or equal(=) to 6.5% indicates diabetes; values between 5.7% and 6.4% may indicate an increased risk of developing diabetes.    Hemoglobin A1c is determined with Roche Cobas immunoassay. The presence of abnormal hemoglobin variants, Thalassemia, and increased concentration of HbF (>7%), may result in lower HbA1c values.   10/24/2023 7.7 (H) <5.7 % HbA1c Final     Comment:     For patients with diabetes, an A1c less than (<) or equal (=) to 7.0% is recommended for most patients, however the goal may be higher or lower depending on age and/or other medical problems.     For a diagnosis of diabetes, A1c greater than (>) or equal(=) to 6.5% indicates diabetes; values between 5.7% and 6.4% may indicate an increased risk of developing diabetes.    Hemoglobin A1c is determined with Roche Cobas immunoassay. The presence of abnormal hemoglobin variants, Thalassemia, and increased concentration of HbF (>7%), may result in lower HbA1c values.   12/24/2019 6.3 (H) <5.7 % Final     Comment:     For patients with diabetes, an A1c less than (<) or equal (=) to 7.0% is recommended for most patients, however the goal may be higher or lower depending on age and/or other medical problems.   For a diagnosis of diabetes, A1c greater than (>) or equal(=) to 6.5% indicates diabetes; values between 5.7% and 6.4% may indicate an increased risk of developing diabetes.      No results found for: ''FERRITIN''      COMPLIANCE DATA:  N/A    ASSESSMENT: 68 y.o. male  has a past medical history of Atrial fibrillation (HCC/RAF), Chicken pox (1965), Diabetes mellitus (HCC/RAF) (10/24/2023), GERD (gastroesophageal reflux disease), Hyperlipidemia (2009), Hypertension (2009), Kidney stone (2018), Seasonal allergies (1969), Skin cancer (2010), and Sleep apnea (2014). with There were no encounter diagnoses. The patient is establishing care for previously diagnosed OSA on CPAP    PLAN:    Obstructive Sleep Apnea:.   Discussed the results of sleep study   Patient agrees to trial of PAP therapy and will participate in PAP initiation and management (PIM) program with follow up with respiratory therapist in clinic--- is awaiting PAP machine (ordered 06/19/2024)  Discussed with patient the logistics and benefits behind nightly PAP therapy  The patient is motivated to start PAP treatment.   Instructed patient to follow up with respiratory therapist/PAP management in 2-3 weeks.          Hx of A fib:   Continue Xarelto  20mg  with dinner   Continue metoprolol  50mg  BID  Continue monitoring symptoms       DME Provider: [facility]  I discussed the plan in detail with the patient who voiced understanding and is in agreement.    ~ 20 minutes was spent on this visit.    Follow up: 2-3 months, sooner if needed       ATTESTATION:    Scribe Signature    I, Rufus JUDITHANN Riffle, have scribed for Dr. Fredia GORMAN Copier with the documentation for Calvin Chen on 06/29/2024 at 11:10 AM    Physician Signature    Dr. Fredia GORMAN Copier 06/29/2024 11:10 AM    I have reviewed this note, scribed by Rufus JUDITHANN Riffle and attest that it is an accurate representation of my H & P and other events of the outpatient visit except if otherwise noted     ***

## 2024-07-08 ENCOUNTER — Ambulatory Visit: Payer: PRIVATE HEALTH INSURANCE

## 2024-07-08 DIAGNOSIS — E1159 Type 2 diabetes mellitus with other circulatory complications: Secondary | ICD-10-CM

## 2024-07-08 DIAGNOSIS — R350 Frequency of micturition: Secondary | ICD-10-CM

## 2024-07-08 DIAGNOSIS — I152 Hypertension secondary to endocrine disorders: Secondary | ICD-10-CM

## 2024-07-08 DIAGNOSIS — E669 Obesity, unspecified: Secondary | ICD-10-CM

## 2024-07-08 DIAGNOSIS — I480 Paroxysmal atrial fibrillation: Principal | ICD-10-CM

## 2024-07-08 DIAGNOSIS — E785 Hyperlipidemia, unspecified: Secondary | ICD-10-CM

## 2024-07-08 DIAGNOSIS — E119 Type 2 diabetes mellitus without complications: Secondary | ICD-10-CM

## 2024-07-08 DIAGNOSIS — E1169 Type 2 diabetes mellitus with other specified complication: Secondary | ICD-10-CM

## 2024-07-08 DIAGNOSIS — Z23 Encounter for immunization: Secondary | ICD-10-CM

## 2024-07-08 NOTE — Progress Notes
 [place] [facility]     Primary Care Progress Note        Patient:  Calvin Chen  Medical record number:  [id number]  Date of birth:  1956-03-04  Date of service:  07/08/2024    Chief Complaint   Patient presents with    Follow-up     51mo follow up        BP 153/87  ~ Pulse 67  ~ Temp 36.2 ?C (97.2 ?F) (Tympanic)  ~ Wt 218 lb (98.9 kg)  ~ SpO2 97%  ~ BMI 35.19 kg/m?         Ambient Scribe Use Consent: This note was generated using assisted transcription with the verbal consent of the patient/guardian. The note has been reviewed and edited accordingly by me.         SUBJECTIVE:     Calvin Chen is a 68 y.o. male who presents for :    History of Present Illness    Rectal Bleeding  - Noted rectal bleeding over past couple of weeks prior to visit  - Bleeding described as ''a bit'' initially, decreased to almost none in last couple of days  - Stopped Xarelto  20 mg daily during period of bleeding    Urinary Frequency  - Increased urinary frequency, urinating every hour, especially while driving  - No difficulty initiating flow, but sensation of something ''squeezing the tube''  - Stream described as normal, but with sensation of restriction  - History of BPH and prior use of Flomax  for urinary symptoms  - Reports increased urination at night in past, previously attributed to BPH before diagnosis of sleep apnea    Daytime Sleepiness  - Sleepy between 1:00 and 3:00 PM      CPAP Use  - History of sleep apnea, currently using CPAP at night    Hypertension  - Blood pressure elevated (153/87) despite current regimen  - Taking lisinopril /hydrochlorothiazide  20/25, 2 tablets daily, and metoprolol  50 mg twice daily  - Will monitor blood pressure at home    Type 2 Diabetes Mellitus  - Glycemic control managed with Rybelsus  7 mg daily and metformin 500 mg twice daily  - No side effects with current Rybelsus  dose  - Appetite reducing    Dyslipidemia  - Lipids well controlled on atorvastatin  40 mg daily    Misc  - Working on diet and trying to do more walking       1. Type 2 dm : taking rybelsus  3mg  and metformin 500mg  bid   Does not want injections   Taking rybelsus  3mg    No side effects   Still has a good appetite   Does not take it every day      Diet: eating less  Exercise: walks 6 days per week, bike ride once a week      [facility]:     HBA1C 7.8 (A) 08/23/2023   HBA1C 8.5 (H) 06/20/2023   HBA1C 6.9 (H) 04/12/2022   HBA1C 7.4 (A) 01/11/2022            2. Hypertension : did not take meds this morning  Stressed lately   Not checking bp at home      taking lisinopril /hct 20/25mg  2 tablets daily and metoprolol  25mg  bid - was supposed to increase metoprolol  dose but pharmacy gave patient him 25mg  again     3.  Dyslipidemia: was taking atorvastatin  40mg  daily, but pharmacy gave him 20mg  by accident on last prescription  Does forget sometimes      4.  Atrial fibrillation:   Needs new cardiology referral  Taking xarelto  20mg  daily and metoprolol  25mg  bid   supposed to increase metoprolol  dose but pharmacy gave patient him 25mg  again    5.  Sleep apnea:   Using cpap        Patient Active Problem List   Diagnosis    Atrial fibrillation (HCC/RAF)    Benign prostatic hyperplasia    Erectile dysfunction    HTN (hypertension)    Hyperlipidemia    Long term current use of anticoagulant    OSA on CPAP    Obesity    Urinary tract infectious disease    Hypertensive urgency    Diabetes mellitus (HCC/RAF)    Angiomyolipoma of right kidney    History of colon polyps    Liver cyst    Microalbuminuria due to type 2 diabetes mellitus (HCC/RAF)       Medication:  Medications that the patient states to be currently taking   Medication Sig    atorvastatin  40 mg tablet Take 1 tablet (40 mg total) by mouth daily.    clindamycin 1% gel     ketoconazole 2% cream     metFORMIN 500 mg tablet Take 1 tablet (500 mg total) by mouth two (2) times daily.    metoprolol  tartrate 50 mg tablet Take 1 tablet (50 mg total) by mouth two (2) times daily.    rivaroxaban  (XARELTO ) 20 mg tablet Take 1 tablet (20 mg total) by mouth daily with dinner Please make appointment for follow up.    Semaglutide  (RYBELSUS ) 7 MG TABS Take 1 tablet (7 mg total) by mouth daily.    [DISCONTINUED] LISINOPRIL -HYDROCHLOROTHIAZIDE  20-25 mg tablet TAKE 2 TABLETS DAILY       Allergies:   Allergies   Allergen Reactions    Nsaids Rash and Hives    Sulfa Antibiotics Rash     Bactrim  Diffuse maculopapular rash, fever    Naproxen Rash       Social History     Tobacco Use    Smoking status: Never     Passive exposure: Never    Smokeless tobacco: Never   Substance Use Topics    Alcohol use: Yes     Alcohol/week: 2.4 - 4.2 oz     Types: 4 - 7 Glasses of Wine (5 oz) per week     Comment: 0.5-1 glass daily          Family History   Problem Relation Age of Onset    Diabetes Mother     Heart disease Mother     Diabetes Father     Other (mitral valve disease) Brother     Anesthesia problems Neg Hx     Malignant hyperthermia Neg Hx        Review of Systems: see HPI      PHYSICAL EXAM:     BP 153/87  ~ Pulse 67  ~ Temp 36.2 ?C (97.2 ?F) (Tympanic)  ~ Wt 218 lb (98.9 kg)  ~ SpO2 97%  ~ BMI 35.19 kg/m?   BP Readings from Last 10 Encounters:   07/08/24 153/87   04/15/24 169/113   12/19/23 136/74   11/05/23 132/78   10/24/23 155/94   11/19/20 174/99   05/16/20 157/93   04/07/20 133/81   02/29/20 142/68   02/12/20 121/72     Wt Readings from Last 10 Encounters:   07/08/24 218 lb (98.9 kg)  06/29/24 207 lb (93.9 kg)   04/15/24 216 lb (98 kg)   12/19/23 215 lb 3.2 oz (97.6 kg)   11/05/23 212 lb 12.8 oz (96.5 kg)   10/24/23 215 lb (97.5 kg)   11/19/20 208 lb (94.3 kg)   05/16/20 207 lb (93.9 kg)   04/07/20 207 lb (93.9 kg)   02/12/20 194 lb (88 kg)       GEN: WDWN. NAD.  Physical Exam    - CARDIOVASCULAR: Normal heart sounds.  - LUNGS: Clear breath sounds.         STUDIES:     Relevant labs and imaging were reviewed today by me in conjunction with the patient.    Results    - Total cholesterol 83 mg/dL  - Urinalysis negative for infection Office Visit on 04/15/2024   Component Date Value Ref Range Status    Hgb A1c 04/15/2024 7.8 (H)  <5.7 % HbA1c Final    For patients with diabetes, an A1c less than (<) or equal (=) to 7.0% is recommended for most patients, however the goal may be higher or lower depending on age and/or other medical problems.     For a diagnosis of diabetes, A1c greater than (>) or equal(=) to 6.5% indicates diabetes; values between 5.7% and 6.4% may indicate an increased risk of developing diabetes.    Hemoglobin A1c is determined with Roche Cobas immunoassay. The presence of abnormal hemoglobin variants, Thalassemia, and increased concentration of HbF (>7%), may result in lower HbA1c values.    Sodium 04/15/2024 139  135 - 146 mmol/L Final    Potassium 04/15/2024 4.3  3.6 - 5.3 mmol/L Final    Chloride 04/15/2024 102  96 - 106 mmol/L Final    Total CO2 04/15/2024 26  20 - 30 mmol/L Final    Anion Gap 04/15/2024 11  8 - 19 mmol/L Final    Anion gap (AG) is calculated using AG = Na -(Cl+CO2). Variations in the measurements of sodium, chloride, and CO2 can affect AG results.   In patients with hypoalbuminemia, the AG can be ''corrected'' by adding 2.5 to the calculated anion gap for every 1 g/dL decrease in albumin concentration.    Glucose 04/15/2024 127 (H)  65 - 99 mg/dL Final    Creatinine 91/72/7974 1.11  0.60 - 1.30 mg/dL Final    Estimated GFR 04/15/2024 74  See GFR Additional Information mL/min/1.51m2 Final    GFR Additional Information 04/15/2024 See Comment   Final    GFR >89.........SABRANormal   GFR 60 - 89...SABRASABRANormal to mildly decreased   GFR 45 - 59...SABRASABRAMildly to moderately decreased   GFR 30 - 44...SABRASABRAModerately to severely decreased   GFR 15 - 29...SABRASABRASeverely decreased   GFR <15.........SABRAKidney failure   The 2021 CKD-EPI creatinine equation was used to calculate the estimated GFR and assumes stable creatinine concentrations.   Results are in mL/min/1.73 square meters.  The patient's eGFR MAY need to be adjusted for drug dosing.   For drug dosing, utilize eGFR or eCrCl.   If using the eGFR in very large or very small patients, then multiply the reported eGFR by the estimated BSA and divide by 1.73 m2, in order to obtain eGFR in units of mL/min.    Urea Nitrogen 04/15/2024 26 (H)  7 - 22 mg/dL Final    Calcium  04/15/2024 9.1  8.6 - 10.4 mg/dL Final       Lab Results   Component Value Date    CHOL 164 10/24/2023  CHOLHDL 49 10/24/2023    CHOLDLCAL 83 10/24/2023    TRIGLY 158 (H) 10/24/2023         ASSESSMENT & PLAN:       Diagnoses and all orders for this visit:    Paroxysmal atrial fibrillation (HCC/RAF)  -      Cardiology    Type 2 diabetes mellitus without complication, without long-term current use of insulin (HCC/RAF)  -     Hgb A1c; Future  -     Basic Metabolic Panel; Future  -     Albumin/Creat Ratio Ur; Future    Hypertension associated with diabetes (HCC/RAF)  -      Cardiology    Dyslipidemia associated with type 2 diabetes mellitus (HCC/RAF)  -      Cardiology    Obesity (BMI 30-39.9)    Urinary frequency    Need for COVID-19 vaccine  -     COVID-19 vaccine IM [place]) age 54 years and older    Other orders  -     valsartan 320 mg tablet; Take 1 tablet (320 mg total) by mouth daily.        Assessment & Plan     Paroxysmal atrial fibrillation:  - Atrial fibrillation appears controlled post-ablation, with no episodes reported since the procedure.  - Restart Xarelto  20 mg daily for stroke prophylaxis. If rectal bleeding recurs, contact physician to consider dose reduction to 10 mg. Continue metoprolol  50 mg twice daily. Referral to cardiologist provided for follow-up.    Type 2 diabetes mellitus without complication, without long-term current use of insulin:  - Glycemic control under ongoing management with Rybelsus  and metformin. Appetite reduction noted, no side effects reported with Rybelsus .  - Continue Rybelsus  7 mg daily and metformin 500 mg twice daily. Ordered A1c to assess glycemic control. If blood sugar remains elevated, consider increasing Rybelsus  to 14 mg daily or switching to injectable GLP-1 agonist. Schedule eye exam for diabetic retinopathy screening.    Hypertension associated with diabetes:  - Blood pressure remains elevated despite current regimen.  - Discontinue lisinopril /hydrochlorothiazide . Initiate valsartan 320 mg daily. Monitor blood pressure at home and report readings. If hypertension persists, consider adding low-dose amlodipine or thiazide diuretic. Follow-up scheduled for early next year to reassess blood pressure and medication efficacy.    Dyslipidemia associated with type 2 diabetes mellitus:  - Lipid levels well controlled on current therapy.  - Continue atorvastatin  40 mg daily.    Obesity (BMI 30-39.9):  - Appetite reduction observed, working on diet and increasing physical activity.  - Continue dietary modifications and increase walking. Monitor weight and reinforce lifestyle changes.    Urinary frequency:  - Urinary frequency may be related to benign prostatic hyperplasia and/or diuretic use. No evidence of urinary tract infection.  - Discontinue hydrochlorothiazide . Referral to urologist for further evaluation and management. Declined prostate exam in office, will defer to urologist. Reduce caffeine intake.    Need for COVID-19 vaccine:  - COVID-19 booster status uncertain.  - Ordered COVID-19 vaccine. If booster already received, no further action needed.             Return in about 7 weeks (around 08/24/2024).    Call or return to clinic prn if these symptoms worsen or fail to improve as anticipated.     The above plan was explained and reviewed with the patient.  All questions were answered.    40 minutes were spent personally by me today on this encounter which may include  today's pre-visit review of the chart, time spent during the visit, and today's time spent after the visit  documenting and coordinating care.       Viviane Fusi, MD  Family Medicine  [facility]

## 2024-07-09 LAB — Albumin/Creatinine Ratio,Urine: ALBUMIN/CREATININE RATIO: 268.3 ug/mg — ABNORMAL HIGH (ref <30.0)

## 2024-07-09 LAB — Hgb A1c: HGB A1C: 8.1 % HbA1c — ABNORMAL HIGH (ref <5.7)

## 2024-07-09 LAB — Basic Metabolic Panel
CALCIUM: 9.3 mg/dL (ref 8.6–10.4)
POTASSIUM: 3.9 mmol/L (ref 3.6–5.3)

## 2024-07-09 MED ORDER — VALSARTAN 320 MG PO TABS
320 mg | ORAL_TABLET | Freq: Every day | ORAL | 0 refills | 30.00000 days | Status: AC
Start: 2024-07-09 — End: ?

## 2024-07-10 MED ORDER — SEMAGLUTIDE 14 MG PO TABS
14 mg | ORAL_TABLET | Freq: Every day | ORAL | 0 refills | 28.00000 days | Status: AC
Start: 2024-07-10 — End: ?

## 2024-07-14 ENCOUNTER — Other Ambulatory Visit: Payer: PRIVATE HEALTH INSURANCE

## 2024-07-15 DIAGNOSIS — E119 Type 2 diabetes mellitus without complications: Principal | ICD-10-CM

## 2024-07-15 NOTE — Telephone Encounter
 Referral done, please contact patient when ready.

## 2024-07-18 MED ORDER — RIVAROXABAN 20 MG PO TABS
20 mg | ORAL_TABLET | Freq: Every day | ORAL | 0 refills
Start: 2024-07-18 — End: ?

## 2024-07-19 ENCOUNTER — Telehealth: Payer: PRIVATE HEALTH INSURANCE

## 2024-07-19 MED ORDER — RIVAROXABAN 20 MG PO TABS
20 mg | ORAL_TABLET | Freq: Every day | ORAL | 3 refills | 30.00000 days | Status: AC
Start: 2024-07-19 — End: ?

## 2024-07-23 ENCOUNTER — Other Ambulatory Visit: Payer: PRIVATE HEALTH INSURANCE

## 2024-07-24 ENCOUNTER — Other Ambulatory Visit: Payer: PRIVATE HEALTH INSURANCE

## 2024-07-24 DIAGNOSIS — E119 Type 2 diabetes mellitus without complications: Secondary | ICD-10-CM

## 2024-07-24 NOTE — Progress Notes
 Visit type: video telehealth       Summary   Patient is seen for preventative care and chronic condition management.       SUMMARY OF VISIT       Considerations for PCP:    1. Neck and shoulder pain: patient is requesting a referral to chiropractor for treatment of this.     Referrals Ordered    [x]  Audiology: pt provided with phone number for scheduling    Care Gaps Review   Annual Wellness Visit:   [x]  Already completed with PCP on 10/24/2023    Advance Directives:    [x]  Not on file. Discussed treatment options and how to complete form.     Laboratory: ordered this visit   [x]  Hepatitis B Screening    Vaccinations: advised on where to access vaccine   [x]  Tdap   [x]  Shingles #2/2    Cancer screening:     [x]  Up to date/Not applicable    Other:   [x]  Diabetic eye exam: referral ordered; pt provided with phone number for scheduling        HEALTH ASSESSMENT QUESTIONNAIRE           07/26/2024   AMB HRA Data View   During the past 4 weeks, how would you rate your health in general? Good    During the past 4 weeks, has your physical and emotional health limited your social activities with family, friends, neighbors,or groups? Slightly    During the past 4 weeks, how much bodily pain have you generally had? Moderate pain        Patient-reported           FUNCTIONAL SCREEN           07/26/2024   AMB HRA Functional Screen Data   Do you need help with feeding yourself, getting dressed,grooming,bathing or showering,getting to the toilet,or getting around the house? No    Do you need help with using the telephone? No    Do you need help with taking your medicine? No    Do you need help with preparing meals? No    Do you need help with managing money (tracking expenses, paying bills)? No    Do you need help doing moderately strenuous housework (e.g., laundry)? No    Do you need help with shopping for groceries or personal items such as toileteries or medicines? No    Do you need help with getting places beyond walking distance (bus, taxi, or car)? No    Do you need help with driving? No    Do you provide care for a family member? No        Patient-reported           SAFETY ASSESSMENT       Gait    Generally feel stable when walking?  [x]  Yes      Assistive Devices:   [x]  None    Within the last year, have you been afraid of your partner or ex-partner?: No (07/27/2024 12:00 PM)          07/26/2024   AMB HRA Safety Assessment Section View   Have you fallen in the last 12 months? No    Does your home have rugs in the hallway? No   Does your home have poor lighting? No    Does your home lack grab bars in the bathroom? Yes    Does your home lack handrails on the stairs? No    Do  you always wear a seatbelt when you ride in the car?  Yes        Patient-reported           SENSORY SCREEN       Hearing   Reports hearing difficulties, mostly when there is background traffic noise    Assistive Device: [x]  N/A   Last exam: n/a     Vision   Assistive Device: [x]  Glasses    Last eye exam: late 2024/early 2025    Dental  Denies dental concerns   Assistive Device: [x]  N/A    Last exam: more than 1 year ago, states this is on his list to schedule       MENTAL HEALTH SCREENING       Stress:   Do you feel stress - tense, restless, nervous, or anxious, or unable to sleep at night because your mind is troubled all the time - these days?: Only a little (07/27/2024 12:00 PM)  Patient reports work stress at times. For tension reduction he does stretching, watches music videos, and does writing (writers group). He also briefly cites politics as a source of stress.     Memory:   Any concern re: memory: [x]  ''I'm finding my recollection of trivia is reduced. Also, I'll see a TV show and there is an actor in it and I do not remember their name immediately.''    Mini-Cog Total Score:   MINI COG ASSESSMENT  Word Administration: Version 3: [place], [address], [place] (07/27/2024 12:00 PM)  Clock Drawing: 2 (07/27/2024 12:00 PM)  Three word recall: : 3 Word Recall (07/27/2024 12:00 PM)  Mini Cog Score: 5 (07/27/2024 12:00 PM)        PHQ-2 Total Score:      01/20/2018 07/27/2024   PHQ-2   Little interest or pleasure in doing things  No  Not at all   Feeling down, depressed, or hopeless No  Not at all   PHQ-2 Score 0  0       Data saved with a previous flowsheet row definition       GAD-2 Total Score:      07/27/2024   GAD-2   Feeling nervous, anxious, or on edge Not at all   Not being able to stop or control worrying Not at all   GAD-2 Score 0       DAST-10 Total Score:      01/20/2018     2:30 PM   DAST-10 Total Score   In the last year, have you used an illegal drug or prescription medication for non-medical reasons? No    Total Score 0        Data saved with a previous flowsheet row definition        AUDIT-C Total Score:      01/20/2018     2:30 PM 07/27/2024    12:00 PM   Audit-C Total Score   Total Score 5  4       Data saved with a previous flowsheet row definition       Tobacco:   Tobacco use?   [x]  Denies         ADVANCE CARE PLANNING         GOALS OF CARE & ADVANCE CARE PLANNING NOTE          Prognostic Awareness/Understanding of Treatment Intent:   Good    Does your patient understand the illness trajectory and treatment intent?  ''How do you feel things  are going?''   Values/Goals:    Values:   ''Family. Being creative; my writing is important to me.''    Goals:   ''Trying to increase exercise and lose weight.''   Has been having a lot of stiffness and pain in his right neck and shoulder area.  ''Based on the understanding of your disease...''  - What is most important to you?  - What are your priorities right now?  - What are you hoping for?   - What are you most concerned about?    Examples: being at home, being mentally aware, being in control of decisions, not being a burden, achieving life goals, supporting my children, maintaining autonomy   Treatment Preferences:   Not certain at this time      ''Based on your values, which treatments still appear to be worthwhile to achieve your goals''    ''What are treatments you would like to avoid if your quality of life worsens?''    Family/Social support:  Jahshua Bonito (spouse)   Mccoy Testa (brother)   Kahlin Mark (brother)       Surrogate Decision Maker:    Patient still considering         Outcome and Plan  Harvis Mabus has no Advance Directive or POLST in [facility] and has the capacity to identify a surrogate management consultant. 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 He was encouraged to provide a copy to his PCP once completed (after notary or witness).       5 minutes were spent on discussion of goals of care, counseling of the patient and/or family, and coordination of care.      CHRONIC CONDITION REVIEW       Preventative Health Care    []  New  []  Improved      []  Worse     [x]  Stable      []  Not Controlled    SUBJECTIVE  -Med Adherence?  [x]  Yes   -Lifestyle:   -Diet: working on low carb diet, reducing beef intake, drinks ~ 1/2 glass of wine per night    -Exercise: walking 2 miles most days of the week, biking once per week 6 miles on the weekend  -Sleep - Feels well-rested on awakening?  [x]  Yes   -Significant changes in family medical hx in last 12 months? No  -See Health Assessment Questionnaire, Social Drivers of Health, Sensory Screening, and Functional Screening above for additional information    PLAN:   [x]  Care Team updated on EMR to include health care providers seen regularly and/or within the last 3 years  [x]  Treatment adherence: Patient counseled re: importance of compliance with treatment regimen and communicating with providers regarding any issues as there may be alternate options available  [x]  Diet: Patient counseled re: healthy diet that includes fruits, vegetables, whole grains, and lean proteins as well as avoiding foods high in sodium, saturated/trans fats, and added sugars  [x]  Exercise: Patient counseled re: need for at least 150 minutes of moderate-intensity or 75 minutes of vigorous-intensity aerobic physical activity per week  [x]  Preventative care: Patient counseled re: importance of annual wellness visits, regular follow-up with PCP for chronic conditions, annual eye exams, and regular dental exams  [x]  Safety: Patient counseled re: importance of protection against UV rays, seatbelt use, fall-prevention measures  [x]  Discussed advance care planning and goals of care. See ADVANCE CARE PLANNING section above    Need for vaccination  DATA REVIEW  -Patient meets criteria for the following vaccinations: Tdap and Shingrix    PLAN:   [x]  Patient counseled re: indication and purpose vaccination; patient ACCEPTED  [x]  Patient advised to get Tdap and Shingrix at pharmacy     Need for Hepatitis B Screening      DATA REVIEW  Screening criteria :   [x]  Adults 18 years and older with no prior screening in lifetime  []  Adults 18 years and older with risk factors and no screening in last year  []  Adults 18 years and older requesting screening    PLAN:   [x]  Patient counseled re: indications for hepatitis B screening; patient accepted   [x]  Hepatitis B surface antigen (HBS Antigen), hepatitis B surface antibody (Hep B Surface Ab Quant), hepatitis B core antibody (Hep B Core Ab Total) ordered  [x]  Follow up with PCP    Atrial Fibrillation: Paroxysmal    []  New  []  Improved      []  Worse     [x]  Stable       []  Not Controlled     DATA REVIEW  Relevant labs, screening tests:   [facility] 11/26/2023: Patient had a min HR of 47 bpm, max HR of 141 bpm, and avg HR of 65  bpm. Predominant underlying rhythm was Sinus Rhythm. 9 Supraventricular Tachycardia runs occurred, the run with the fastest interval lasting 7 beats with a max rate of 141 bpm, the longest lasting 8 beats with an avg rate of 120 bpm. Some episodes of Supraventricular Tachycardia may be possible Atrial Tachycardia with variable block. Isolated SVEs were rare (<1.0%), SVE Couplets were rare (<1.0%), and SVE Triplets were rare (<1.0%). Isolated VEs were rare (<1.0%, 31), VE Triplets were rare (<1.0%, 1), and no VE Couplets were present. Ventricular Trigeminy was present.    S/P RF Ablation on 12/28/2021 with [facility].    CHA2DS2-VASc = 3   [x]  Age: 86-74 years (+1); 75+ years (+2)   []  Gender: Male (+1)   []  CHF: Presence (+1)   [x]  HTN: Presence (+1)   []  CVA/TIA/Thromboembolism (+2)   []  Vascular disease: MI, PAD, or aortic plaque (+1)    [x]  Diabetes: Presence (+1)    Medication: Xarelto  20 mg daily, metoprolol  tartrate 50 mg twice daily     -Last visit with FRANCO MYRTIS BRING, MD, PhD in Medicine, Cardiovascular Disease on 11/05/2023.     SUBJECTIVE  ROS:    -denies  dizziness, lightheadedness, SOB, palpitations, and chest discomfort  -reports sometimes if he exercises right after eating he feels a little SOB, when he slows down or belches it gets better  -see LIFESTYLE REVIEW for diet, exercise, alcohol intake    PLAN  [x]  Patient counseled re: monitoring for palpitations, shortness of breath, dizziness, or chest discomfort  [x]  Patient counseled re: adequate hydration  [x]  Continue medication  [x]  Follow up with cardiology    Hypercoagulable State due to Atrial Fibrillation   Current Long Term use of Anticoagulation    []  New  []  Improved      []  Worse     [x]  Stable      []  Not Controlled    DATA REVIEW  Relevant labs, screening tests:  CHA2DS2-VASc = 3    Medication: Xarelto  20 mg daily     -Last visit with FRANCO MYRTIS BRING, MD, PhD in Medicine, Cardiovascular Disease on 11/05/2023.    SUBJECTIVE  ROS:    -denies active bleeding, excessive bruising   -reports  long term hemorrhoids with some BR bleeding at times     PLAN:   [x]  Monitor for shortness of breath or unilateral extremity swelling, bleeding or excessive bruising   [x]  Patient counseled re: fall precautions  [x]  Continue medication  [x]  Consider monitoring CBC, aPTT/PT, serum creatinine, and LFT annually per AHA/ACC PLAN  [x]  Follow up with PCP    Type 2 Diabetes with complications: HLD, HTN, CKD stage 3a, microalbuminuria    []  New  []  Improved      []  Worse     []  Stable      [x]  Not Controlled    DATA REVIEW  Relevant labs, screening tests:   Lab Results   Component Value Date    HGBA1C 8.1 (H) 07/08/2024    HGBA1C 7.8 (H) 04/15/2024    HGBA1C 7.7 (H) 10/24/2023     Lab Results   Component Value Date    GLUCOSE 149 (H) 07/08/2024    GLUCOSE 127 (H) 04/15/2024    GLUCOSE 197 (H) 10/24/2023     -Last Retinal exam: unknown     Medication: Rybelsus  14 mg daily, metformin  500 mg twice daily, atorvastatin  40 mg daily, valsartan  320 mg daily     SUBJECTIVE  ROS:    -reports he was taking a lower dose of Rybelsus  than he should have prior to his last A1C, hopes to see an improvement in his next reading  -see LIFESTYLE REVIEW for diet, exercise, alcohol intake    PLAN  [x]  Continue medication  [x]  Continue to monitor A1C, urine microalbumin, BMP, lipids  [x]  Diabetic eye exam ordered; patient prefers to do exam at [facility], [phone] Phone number provided. Patient does not have any history of seizures.  [x]  Continue follow up with PCP    Chronic Kidney Disease, stage 3a    []  New  [x]  Improved      []  Worse     []  Stable      []  Not Controlled    DATA REVIEW  Relevant labs, screening tests:   Lab Results   Component Value Date    GFRESTCKDEPI 81 07/08/2024    GFRESTCKDEPI 74 04/15/2024    GFRESTCKDEPI 71 10/24/2023     Lab Results   Component Value Date    GFRESTNOAA 69 11/19/2020    GFRESTNOAA 54 04/07/2020    GFRESTNOAA 53 12/24/2019     Lab Results   Component Value Date    ALBCREATUR 268.3 (H) 07/08/2024     Medication: valsartan  320 mg daily     SUBJECTIVE  ROS:    -Daily fluid intake: 48 oz   -denies weakness, fatigue, and edema    PLAN  [x]  Patient counseled re: importance of BP control, hydration, and avoidance of NSAIDS  [x]  Consider monitoring PTH  [x]  Continue medication  [x]  Follow up with PCP      Renal Angiomyolipoma, right     []  New  []  Improved      []  Worse     [x]  Stable      []  Not Controlled    DATA REVIEW  Relevant labs, screening tests:   US  Abdomen 01/01/2023 [facility], Care Everywhere): There is a 1.0 x 0.8 x 0.7 cm right upper pole echogenic renal nodule most consistent with angiomyolipoma.     SUBJECTIVE  ROS  -reports history of high blood pressure      PLAN:   [x]  Continue follow up with PCP     Prostate  Hypertrophy: with LUTS    []  New  []  Improved      []  Worse     [x]  Stable     []  Not controlled    DATA REVIEW  Relevant labs, screening tests:   CT abd/pelvis 05/19/2018: mild prostatomegaly     Lab Results   Component Value Date    PSATOTAL 1.9 12/24/2019    PSATOTAL 0.97 04/01/2008    PSASCN 2.2 10/24/2023     Medication: none    SUBJECTIVE  ROS:    -reports urinary frequency and nocturia x1-2  -reports he has a referral to urology and will call today to schedule that appt     PLAN:  [x]  Patient counseled re: monitoring for new or worsening urinary symptoms  [x]  Continue monitoring PSA  [x]  Follow up with PCP; plans to schedule with urology    Hypertensive Kidney Disease (CKD stage 3a)    []  New  []  Improved      []  Worse     []  Stable      [x]   Not Controlled    DATA REVIEW:  Relevant labs, screening tests:   BP Readings from Last 3 Encounters:   07/08/24 153/87   04/15/24 169/113   12/19/23 136/74     Lab Results   Component Value Date    GFRESTCKDEPI 81 07/08/2024    GFRESTCKDEPI 74 04/15/2024    GFRESTCKDEPI 71 10/24/2023     Lab Results   Component Value Date    GFRESTNOAA 69 11/19/2020    GFRESTNOAA 54 04/07/2020    GFRESTNOAA 53 12/24/2019     Lab Results   Component Value Date    GFRESTAA 80 11/19/2020    GFRESTAA 62 04/07/2020    GFRESTAA 62 12/24/2019     Medications: valsartan  320 mg daily, metoprolol  tartrate 50 mg twice daily    -Last visit with  FRANCO MYRTIS BRING, MD, PhD in Medicine, Cardiovascular Disease on 11/05/2023.    SUBJECTIVE  ROS:    -denies home BP monitoring  -denies headaches, chest discomfort, and shortness of breath  -see Preventative Health Care for diet, exercise, alcohol review    PLAN  [x]  Discussed goal BP < 130 mm Hg systolic per ACC/AHA guidelines  [x]  Patient counseled re: diet, exercise, home BP monitoring, hydration  [x]  Continue medication  [x]  Continue checking BMP, urine alb/creat  [x]  Continue follow up with PCP and cardiology    Mixed Hyperlipidemia    []  New  []  Improved      []  Worse     [x]  Stable      []  Not Controlled    DATA REVIEW  2018 ACC/AHA guidelines recommends moderate or high-intensity statin because of diabetes. Patient is receiving guideline-directed statin therapy; monitor adherence.  10-year ASCVD risk  is 33.31% as of 1:21 PM on 07/27/2024  10-year ASCVD risk with optimal risk factors is 9.6%.    Relevant labs, screening tests:   Lab Results   Component Value Date    CHOLHDL 49 10/24/2023    CHOLHDL 49 06/15/2019    CHOLHDL 48 01/20/2018     Lab Results   Component Value Date    CHOLDLCAL 83 10/24/2023    CHOLDLCAL 94 06/15/2019    CHOLDLCAL 114 (H) 01/20/2018     Lab Results   Component Value Date    TRIGLY 158 (H) 10/24/2023    TRIGLY 195 (H) 06/15/2019    TRIGLY 178 (H) 01/20/2018     Lab Results  Component Value Date    NOHDLCHOCAL 115 10/24/2023    NOHDLCHOCAL 133 (H) 06/15/2019    NOHDLCHOCAL 150 (H) 01/20/2018      Medication: atorvastatin  40 mg daily     -Last visit with  FRANCO MYRTIS BRING, MD, PhD in Medicine, Cardiovascular Disease on 11/05/2023.    SUBJECTIVE  ROS:    -see Preventative Health Care for diet, exercise, alcohol review    PLAN  [x]  Patient counseled re: importance of heart healthy diet, 150 minutes of cardiovascular exercise weekly  [x]  Continue medication  [x]  Continue follow up with PCP and cardiology     Obstructive Sleep Apnea on CPAP    []  New  []  Improved      []  Worse     [x]  Stable      []  Not Controlled    DATA REVIEW  Relevant labs, screening tests:   Sleep study 06/10/2024:   This HSAT was diagnostic for severe obstructive sleep apnea with respiratory event index (REI) of 40/hour of evaluation time and minimum oxygen saturation of 69%. Cumulative time with SpO2 <=88% of 48 min.     -Last visit with FREDIA CANDIE COPIER, MD in Medicine, Pulmonary Disease on 06/29/2024.    SUBJECTIVE  ROS:    -received a prescription for new CPAP equipment, waiting for that still   -reports consistent CPAP use (night and naps)     PLAN  [x]  Continue CPAP use  [x]  Follow up with PCP and sleep medicine    Class II Obesity; BMI 35    []  New  []  Improved      [x]  Worse     []  Stable      []  Not Controlled    DATA REVIEW  BMI Readings from Last 3 Encounters:   07/08/24 35.19 kg/m?   06/29/24 33.41 kg/m?   04/15/24 34.86 kg/m?      -Comorbidities include HTN, HLD, DM, and OSA    Medication: Rybelsus  14 mg daily     SUBJECTIVE  ROS:    -see LIFESTYLE REVIEW for diet, exercise regimen    PLAN:   []  Patient counseled re: diet, exercise, sustainable weight loss  []  Continue blood pressure, cholesterol, and glucose management   []  Consider monitoring A1C  []  Follow up with PCP    Hx Basal Cell Carinoma    []  New  []  Improved      []  Worse     [x]  Stable      []  Not Controlled    DATA REVIEW  Relevant labs, screening tests:   Pathology 02/26/2019:   A. SKIN, RIGHT NASAL SIDEWALL (SHAVE BIOPSY):  - Basal cell carcinoma, nodular type with squamous differentiation and infiltrative features, ulcerated, extending to the biopsy edges  B. SKIN, LEFT UPPER ARM (SHAVE BIOPSY):  - Dermatofibroma, spindled cells extend to the base of the biopsy   - No malignancy in the sections examined    -Last visit with  REGAN EMERSON ALAS, MD in Dermatology on 12/13/2023.    SUBJECTIVE  ROS  -reports new lesion over his right eye that seems to be growing  -wife also noticed a couple of spots on his back     PLAN:   [x]  Patient counseled re: use of sun protection including sunscreens and sun-protective clothing, monitoring any new or changes in skin lesions   [x]  Follow up with PCP and dermatology    Hearing Difficulty, Bilaterally     []  New  []  Improved []  Worse     [  x] Stable      []  Not Controlled    SUBJECTIVE  ROS  -reports he has noticed some hearing loss that seems to be worse when he is standing near traffic    PLAN:   [x]  Monitor for new or worsening symptoms  [x]  Patient ACCEPTED hearing evaluation  [x]  Patient has [employer] plan; ordered referral to audiology   [x]  Follow up with PCP     Neck and shoulder pain     []  New  []  Improved      []  Worse     [x]  Stable      []  Not Controlled    SUBJECTIVE  ROS  -reports he has been having a lot of pain in his right neck and shoulder area  -has been wearing a neck pillow for support  -is requesting a referral to a chiropractor     PLAN:   [x]  Offered to assist in scheduling an appointment; patient declined at this time, will call PCP office if desired           PHYSICAL EXAM       Limited PE due to nature of telehealth visit    VITAL SIGNS  There were no vitals taken for this visit.      PHYSICAL EXAM  Constitutional:  [x]  NAD   []  Cachexia   []  Obese   []  Deferred    HEENT:  []  Cataracts   []  Hearing aids  [x]  Glasses   []  Deferred        Neuro:  [x]  Mini-Cog:    []  Tremors              []  Microfilament:       []  Deferred    Psych:  [x]  PHQ-2: 0        [x]  GAD-2: 0      []  Withdrawn          []  Anxious/Restless        CARE TEAM       Patient Care Team:  Marcello Prayer, MD as PCP - General (Family Medicine)  Pcp, Unassigned Generic Jacobo Handler as PCP - Plan Assigned    -Last visit with  FRANCO MYRTIS BRING, MD, PhD in Medicine, Cardiovascular Disease on 11/05/2023.  -Last visit with  REGAN EMERSON ALAS, MD in Dermatology on 12/13/2023.  -Last visit with  FREDIA CANDIE COPIER, MD in Medicine, Pulmonary Disease on 06/29/2024.      HEALTH MAINTENANCE       Health Maintenance   Topic Date Due    Routine Hepatitis B Screening (recommended at least once for patients 76 and older)  Never done    Diabetes: Eye Exam  Never done    Shingles Vaccine (Shingrix)  (2 of 2) 03/17/2018    Td (Tetanus) / Tdap (Tetanus, Pertussis-Whooping Cough) Vaccine (2 - Td or Tdap) 06/24/2019    No Advance Directive (AD) or more than 5 years since AD reviewed with your provider. See Advance Directive resources. If AD on file is accurate, let provider know nothing has changed. if not, AD is overdue  Never done    Preventive Wellness Visit  10/23/2024    COVID-19 Vaccine (8 - 2025-26 season) 01/05/2025    Diabetes: Hemoglobin A1c Blood Test  01/05/2025    Diabetes Kidney Monitoring: Urine Test  07/08/2025    Diabetes Kidney Monitoring: Blood Test  07/08/2025    Colorectal Cancer Screening  02/11/2030    Flu Vaccine  Completed    Pneumonia Vaccine  Completed    Routine Hepatitis C Screening (recommended at least once for patients 19-55 years old)  Completed    Discuss cardiovascular risk reduction with your primary care physician.  Completed         MEDICAL HISTORY       Family History       Relation Status Problems (Age of Onset) - (Comment)    Mother Deceased at age 22 Diabetes ;  Heart disease    Father Alive, age 86y Diabetes    Brother Alive Other    Brother Alive            Pertinent Negatives      Neg Hx  Anesthesia problems ;  Malignant hyperthermia            Patient Active Problem List   Diagnosis    Atrial fibrillation (HCC/RAF)    Benign prostatic hyperplasia    Erectile dysfunction    HTN (hypertension)    Hyperlipidemia    Long term current use of anticoagulant    OSA on CPAP    Obesity    Urinary tract infectious disease    Hypertensive urgency    Diabetes mellitus (HCC/RAF)    Angiomyolipoma of right kidney    History of colon polyps    Liver cyst    Microalbuminuria due to type 2 diabetes mellitus (HCC/RAF)       Past Surgical History:   Procedure Laterality Date    CARDIAC ELECTROPHYSIOLOGY MAPPING AND ABLATION      afib    COLONOSCOPY            ALLERGIES       Allergies   Allergen Reactions    Nsaids Rash and Hives    Sulfa Antibiotics Rash     Bactrim  Diffuse maculopapular rash, fever    Naproxen Rash          MEDICATIONS Medications that the patient states to be currently taking   Medication Sig    atorvastatin  40 mg tablet Take 1 tablet (40 mg total) by mouth daily.    clindamycin 1% gel     ketoconazole 2% cream     metFORMIN  500 mg tablet Take 1 tablet (500 mg total) by mouth two (2) times daily.    metoprolol  tartrate 50 mg tablet Take 1 tablet (50 mg total) by mouth two (2) times daily.    rivaroxaban  (XARELTO ) 20 mg tablet Take 1 tablet (20 mg total) by mouth daily with dinner. Please make appointment for follow up    Semaglutide  (RYBELSUS ) 14 MG TABS Take 1 tablet (14 mg total) by mouth daily.    valsartan  320 mg tablet Take 1 tablet (320 mg total) by mouth daily.          FUTURE APPOINTMENTS         Future Appointments         Provider Department Visit Type    08/25/2024 2:40 PM Charolette Fredia RAMAN., MD [facility] Pulmonary & Sleep Medicine VIDEO VISIT -  RETURN    08/26/2024 1:30 PM Marcello Prayer, MD [facility] Family Medicine, Internal Medicine, Pediatrics RETURN    08/28/2024 10:45 AM Galamgam, Jayden M., MD [facility] Primary & Specialty Care DERM RETURN            Thank you for allowing me the opportunity to participate in the care of your patient.  Macario HERO. Raynaldo, FNP  [facility] ~~ [facility]      51 minutes were spent personally by me today on this encounter which include today's pre-visit review of the chart, obtaining appropriate history, performing an evaluation, documentation and discussion of management with details supported within the note for today's visit. The time documented was exclusive of any time spent on the separately billed procedure.

## 2024-07-25 MED ORDER — METFORMIN HCL 500 MG PO TABS
500 mg | ORAL_TABLET | Freq: Two times a day (BID) | ORAL | 1 refills | 90.00000 days | Status: AC
Start: 2024-07-25 — End: ?

## 2024-07-27 ENCOUNTER — Ambulatory Visit: Payer: BLUE CROSS/BLUE SHIELD

## 2024-07-27 DIAGNOSIS — E1159 Type 2 diabetes mellitus with other circulatory complications: Secondary | ICD-10-CM

## 2024-07-27 DIAGNOSIS — Z6835 Body mass index (BMI) 35.0-35.9, adult: Secondary | ICD-10-CM

## 2024-07-27 DIAGNOSIS — H9193 Unspecified hearing loss, bilateral: Principal | ICD-10-CM

## 2024-07-27 DIAGNOSIS — R351 Nocturia: Secondary | ICD-10-CM

## 2024-07-27 DIAGNOSIS — I152 Hypertension secondary to endocrine disorders: Secondary | ICD-10-CM

## 2024-07-27 DIAGNOSIS — Z1159 Encounter for screening for other viral diseases: Secondary | ICD-10-CM

## 2024-07-27 DIAGNOSIS — E785 Hyperlipidemia, unspecified: Secondary | ICD-10-CM

## 2024-07-27 DIAGNOSIS — M542 Cervicalgia: Secondary | ICD-10-CM

## 2024-07-27 DIAGNOSIS — Z85828 Personal history of other malignant neoplasm of skin: Secondary | ICD-10-CM

## 2024-07-27 DIAGNOSIS — N401 Enlarged prostate with lower urinary tract symptoms: Secondary | ICD-10-CM

## 2024-07-27 DIAGNOSIS — I480 Paroxysmal atrial fibrillation: Secondary | ICD-10-CM

## 2024-07-27 DIAGNOSIS — D6869 Other thrombophilia: Secondary | ICD-10-CM

## 2024-07-27 DIAGNOSIS — M25511 Pain in right shoulder: Secondary | ICD-10-CM

## 2024-07-27 DIAGNOSIS — E1122 Type 2 diabetes mellitus with diabetic chronic kidney disease: Secondary | ICD-10-CM

## 2024-07-27 DIAGNOSIS — E1169 Type 2 diabetes mellitus with other specified complication: Secondary | ICD-10-CM

## 2024-07-27 DIAGNOSIS — I129 Hypertensive chronic kidney disease with stage 1 through stage 4 chronic kidney disease, or unspecified chronic kidney disease: Secondary | ICD-10-CM

## 2024-07-27 DIAGNOSIS — N1831 Type 2 diabetes mellitus with stage 3a chronic kidney disease, without long-term current use of insulin (HCC/RAF): Secondary | ICD-10-CM

## 2024-07-27 DIAGNOSIS — Z23 Encounter for immunization: Secondary | ICD-10-CM

## 2024-07-27 NOTE — Goals of Care
 GOALS OF CARE & ADVANCE CARE PLANNING NOTE          Prognostic Awareness/Understanding of Treatment Intent:   Good    Does your patient understand the illness trajectory and treatment intent?  ''How do you feel things are going?''   Values/Goals:    Values:   ''Family. Being creative; my writing is important to me.''    Goals:   ''Trying to increase exercise and lose weight.''   Has been having a lot of stiffness and pain in his right neck and shoulder area.  ''Based on the understanding of your disease...''  - What is most important to you?  - What are your priorities right now?  - What are you hoping for?   - What are you most concerned about?    Examples: being at home, being mentally aware, being in control of decisions, not being a burden, achieving life goals, supporting my children, maintaining autonomy   Treatment Preferences:   Not certain at this time      ''Based on your values, which treatments still appear to be worthwhile to achieve your goals''    ''What are treatments you would like to avoid if your quality of life worsens?''    Family/Social support:  Geremy Rister (spouse)   Brantly Kalman (brother)   Degan Hanser (brother)       Surrogate Decision Maker:    Patient still considering         Outcome and Plan  Calvin Chen has no Advance Directive or POLST in [facility] and has the capacity to identify a surrogate management consultant. 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 He was encouraged to provide a copy to his PCP once completed (after notary or witness).

## 2024-08-03 ENCOUNTER — Telehealth: Payer: PRIVATE HEALTH INSURANCE

## 2024-08-03 NOTE — Telephone Encounter
 Appointment Accommodation Request      Appointment Type: new    Reason for sooner request:   K64.9 (ICD-10-CM) - Hemorrhoids, unspecified hemorrhoid type         Date/Time Requested (If any): NA    Last seen by MD: NA    Any Symptoms:  []  Yes  [x]  No      If yes, what symptoms are you experiencing:   Duration of symptoms (how long):     Patient or caller was offered an appointment but declined.    Patient or caller was advised to seek emergency services if conditions are urgent or emergent.    Patient or caller has been notified of the turnaround time of 1-2 business (days).

## 2024-08-03 NOTE — Telephone Encounter
Called and spoke to patient. Patient has been scheduled and advised of wait times

## 2024-08-07 ENCOUNTER — Other Ambulatory Visit: Payer: PRIVATE HEALTH INSURANCE

## 2024-08-25 ENCOUNTER — Ambulatory Visit: Payer: Commercial Managed Care - Pharmacy Benefit Manager

## 2024-08-25 ENCOUNTER — Other Ambulatory Visit: Payer: PRIVATE HEALTH INSURANCE

## 2024-08-25 NOTE — Progress Notes
 PULMONARY/SLEEP MEDICINE CONSULTATION    Patient Consent to Telehealth   The patient agreed to participate in the video visit prior to joining the visit.          PCP: Marcello Prayer, MD      No chief complaint on file.        DIAGNOSTIC SLEEP TEST(s):  06/10/2024 HSAT   This HSAT was diagnostic for severe obstructive sleep apnea with respiratory event index (REI) of 40/hour of evaluation time and minimum oxygen saturation of 69%. Cumulative time with SpO2 <=88% of 48 min.  The evaluation duration was 9 hours and 57 minutes.       PULMONARY FUNCTION TEST(s):  N/A    CARDIODIAGNOSTIC EXAM(s):  N/A    IMAGING(s):   11/19/2020 CXR  Normal cardiomediastinal silhouette.  Normal lung volumes. No consolidation. Mild pulmonary vascular congestion.  No pleural effusions. No pneumothorax.   No acute osseous abnormalities.    Chief Sleep Complaint: OSA on PAP    HPI:  Calvin Chen is 69 y.o. male  has a past medical history of Atrial fibrillation (HCC/RAF), Chicken pox (1965), Diabetes mellitus (HCC/RAF) (10/24/2023), GERD (gastroesophageal reflux disease), Hyperlipidemia (2009), Hypertension (2009), Hypertensive kidney disease with stage 3a chronic kidney disease (HCC/RAF) (07/27/2024), Kidney stone (2018), Seasonal allergies (1969), Skin cancer (2010), and Sleep apnea (2014). presents today for consult. Referred by Charolette Fredia RAMAN., MD    He was diagnosed with OSA, he is not sure how long ago but probably 10 years ago. He used to wake up every hour, feeling sleepy during the day, witnessed apnea and snoring. He bas been using PAP nightly. Did not have issues with PAP until recently as it is failing (giving him alarm that it is failing). He bought a travel PAP.  Travels ''frequent enough''.  With PAP he is sleeping through the night and feeling more energetic.  He is using FFM. He has not tried in the past nasal mask.    Works as: pensions consultant  Relevant Familial Medical History: Older brother had OSA  Mother had heart disease, Older brother heart disease     Usual bedtime 10-11 PM, falls asleep within 30 minutes.  Once asleep, the patient reports sleep fragmentation 1-2 times/night due to go to the restroom  Wake-up time 6-7 AM, total time in bed: 8 hours  Reports taking 1 daytime naps/week for 1-2 hrs in the late afternoon   On the weekends or off work days, patient reports a similar sleep schedule as noted above with little deviation    Patient and bed partner report: No irregular breathing during sleep  Patient complains of: daytime sleepiness which adversely affects performance of daily activities and quality of life and daytime fatigue  Reports morning headache: No   Reports wakening with dry mouth: Yes: from the CPAP  Reports sleepiness with driving: No   Reports motor vehicle collisions due to drowsy driving: No  Reports daily caffeine consumption: Yes: 2 cups , 1 in am and the other after lunch    Epworth Scale Score (ESS): 14     Reports the urge to move the legs or irritating feeling in the legs that prevents from falling asleep: No  History of abnormal behaviors during sleep: Yes: before using PAP    Reports symptoms suggestive of narcolepsy:   Sleep paralysis:  No  Cataplexy:  No  Hypnagogic or hypnopompic hallucinations: Yes: when falling asleep    Reports allergic rhinitis symptoms: nasal congestion, post-nasal drip, rhinorrhea, sneezing, throat clearing  cough, and itchy eyes, seasonal  Reports symptoms suggestive of gastroesophageal reflux: No    Reports tobacco use: No  Reports daily alcohol consumption: Yes: 4-5 onz of wine with dinner 6-7 pm      INTERVAL HISTORY:  Calvin Chen is 69 y.o., male with  has a past medical history of Atrial fibrillation (HCC/RAF), Chicken pox (1965), Diabetes mellitus (HCC/RAF) (10/24/2023), GERD (gastroesophageal reflux disease), Hyperlipidemia (2009), Hypertension (2009), Hypertensive kidney disease with stage 3a chronic kidney disease (HCC/RAF) (07/27/2024), Kidney stone (2018), Seasonal allergies (1969), Skin cancer (2010), and Sleep apnea (2014).. Last seen on 06/29/2024, presents today for follow up.    In the interim, patient received new CPAP machine   Data is not available online  Continues using full face mask   Is comfortable   Denies snoring with cpap   2 days ago, patient began having respiratory issues in the afternoon ~2-3pm   Was experiencing SOB, cough, and coughed up sputum with blood   Patient was taken to [facility]  by EMT due to urgency instead of [place]   Was treated for low BP in the ER  Also vomited in the ER  Was given lasix, underwent chest CT, and had cardio evaluation   Cardiologist thought symptoms were caused by spike in BP   Denies sick contacts  Denies fever   Previously, patient was not measuring BP at home   Today 143/93  Now back at baseline, feels well   Continues losartan and metoprolol     PCP is Dr. Marcello   Has follow up tomorrow  Has follow up with cardiologist x2 days   Patient continues waking up x1/night ~1-2am to use the restroom       Past Medical History:   Diagnosis Date    Atrial fibrillation (HCC/RAF)     Chicken pox 1965    Diabetes mellitus (HCC/RAF) 10/24/2023    GERD (gastroesophageal reflux disease)     Hyperlipidemia 2009    Hypertension 2009    Hypertensive kidney disease with stage 3a chronic kidney disease (HCC/RAF) 07/27/2024    Kidney stone 2018    Seasonal allergies 1969    Skin cancer 2010    Sleep apnea 2014    Use APAP         Past Surgical History:   Procedure Laterality Date    CARDIAC ELECTROPHYSIOLOGY MAPPING AND ABLATION      afib    COLONOSCOPY           Social History     Tobacco Use    Smoking status: Never     Passive exposure: Never    Smokeless tobacco: Never   Vaping Use    Vaping status: Never Used   Substance Use Topics    Alcohol use: Yes     Alcohol/week: 2.4 - 4.2 oz     Types: 4 - 7 Glasses of Wine (5 oz) per week     Comment: 0.5-1 glass daily    Drug use: No         Family History   Problem Relation Age of Onset    Diabetes Mother Heart disease Mother     Diabetes Father     Other (mitral valve disease) Brother     Anesthesia problems Neg Hx     Malignant hyperthermia Neg Hx          Allergies   Allergen Reactions    Nsaids Rash and Hives    Sulfa Antibiotics Rash  Bactrim  Diffuse maculopapular rash, fever    Naproxen Rash         No outpatient medications have been marked as taking for the 08/25/24 encounter (Appointment) with Charolette Fredia RAMAN., MD.       PHYSICAL EXAM: **limited tele-health visit**  There were no vitals taken for this visit.  General: Well-developed male, in no acute distress.   HEENT: Normocephalic and atraumatic. Lips, gums, dentition Normal.   Respiratory/Chest: Non-labored respirations.  Neuro: Alert, oriented and appropriately interactive. Speech fluent.   Psychological: normal affect      LABS:  Lab Results   Component Value Date    WBC 9.75 10/24/2023    HGB 14.2 10/24/2023    HCT 43.3 10/24/2023    MCV 83.6 10/24/2023    PLT 332 10/24/2023     .RESULAST[NA,K,CL,CO2,BUN,CREAT,CALCIUM ,GLUCOSE,TOTPRO,ALBUMIN,BILITOT,ALKPHOS,AST,ALT    Lab Results   Component Value Date    TSH 1.1 10/24/2023     Hgb A1c   Date Value Ref Range Status   07/08/2024 8.1 (H) <5.7 % HbA1c Final     Comment:     For patients with diabetes, an A1c less than (<) or equal (=) to 7.0% is recommended for most patients, however the goal may be higher or lower depending on age and/or other medical problems.     For a diagnosis of diabetes, A1c greater than (>) or equal(=) to 6.5% indicates diabetes; values between 5.7% and 6.4% may indicate an increased risk of developing diabetes.    Hemoglobin A1c is determined with Roche Cobas immunoassay. The presence of abnormal hemoglobin variants, Thalassemia, and increased concentration of HbF (>7%), may result in lower HbA1c values.   04/15/2024 7.8 (H) <5.7 % HbA1c Final     Comment:     For patients with diabetes, an A1c less than (<) or equal (=) to 7.0% is recommended for most patients, however the goal may be higher or lower depending on age and/or other medical problems.     For a diagnosis of diabetes, A1c greater than (>) or equal(=) to 6.5% indicates diabetes; values between 5.7% and 6.4% may indicate an increased risk of developing diabetes.    Hemoglobin A1c is determined with Roche Cobas immunoassay. The presence of abnormal hemoglobin variants, Thalassemia, and increased concentration of HbF (>7%), may result in lower HbA1c values.   10/24/2023 7.7 (H) <5.7 % HbA1c Final     Comment:     For patients with diabetes, an A1c less than (<) or equal (=) to 7.0% is recommended for most patients, however the goal may be higher or lower depending on age and/or other medical problems.     For a diagnosis of diabetes, A1c greater than (>) or equal(=) to 6.5% indicates diabetes; values between 5.7% and 6.4% may indicate an increased risk of developing diabetes.    Hemoglobin A1c is determined with Roche Cobas immunoassay. The presence of abnormal hemoglobin variants, Thalassemia, and increased concentration of HbF (>7%), may result in lower HbA1c values.      No results found for: ''FERRITIN''      COMPLIANCE DATA:  N/A    ASSESSMENT: 69 y.o. male  has a past medical history of Atrial fibrillation (HCC/RAF), Chicken pox (1965), Diabetes mellitus (HCC/RAF) (10/24/2023), GERD (gastroesophageal reflux disease), Hyperlipidemia (2009), Hypertension (2009), Hypertensive kidney disease with stage 3a chronic kidney disease (HCC/RAF) (07/27/2024), Kidney stone (2018), Seasonal allergies (1969), Skin cancer (2010), and Sleep apnea (2014). with There were no encounter diagnoses. The patient is establishing care  for previously diagnosed OSA on CPAP    PLAN:    Obstructive Sleep Apnea:  Continue with PAP therapy, the patient has acclimated well and is experiencing symptomatic improvement.  Review of data card demonstrates excellent adherence and effective treatment.  The patient is motivated to continue PAP treatment.       Hx of A fib:   Continue Xarelto  20mg  with dinner   Continue metoprolol  50mg  BID  Continue monitoring symptoms       DME Provider: Kimber         I discussed the plan in detail with the patient who voiced understanding and is in agreement.    ~ 20 minutes was spent on this visit.    Follow up: 6-12 months, sooner if needed       ATTESTATION:    Scribe Signature    I, Rufus JUDITHANN Riffle, have scribed for Dr. Fredia GORMAN Copier with the documentation for Jozsef Wescoat on 08/25/2024 at 2:08 PM    Physician Signature    Dr. Fredia GORMAN Copier 08/25/2024 2:08 PM    I have reviewed this note, scribed by Rufus JUDITHANN Riffle and attest that it is an accurate representation of my H & P and other events of the outpatient visit except if otherwise noted     ***

## 2024-08-26 ENCOUNTER — Ambulatory Visit: Payer: BLUE CROSS/BLUE SHIELD

## 2024-08-26 DIAGNOSIS — Z131 Encounter for screening for diabetes mellitus: Secondary | ICD-10-CM

## 2024-08-26 DIAGNOSIS — I480 Paroxysmal atrial fibrillation: Secondary | ICD-10-CM

## 2024-08-26 DIAGNOSIS — J810 Acute pulmonary edema: Principal | ICD-10-CM

## 2024-08-26 DIAGNOSIS — Z6835 Body mass index (BMI) 35.0-35.9, adult: Secondary | ICD-10-CM

## 2024-08-26 DIAGNOSIS — I10 Essential (primary) hypertension: Secondary | ICD-10-CM

## 2024-08-26 DIAGNOSIS — E66812 Class 2 severe obesity due to excess calories with serious comorbidity and body mass index (BMI) of 35.0 to 35.9 in adult: Secondary | ICD-10-CM

## 2024-08-26 DIAGNOSIS — E119 Type 2 diabetes mellitus without complications: Secondary | ICD-10-CM

## 2024-08-26 DIAGNOSIS — E785 Hyperlipidemia, unspecified: Secondary | ICD-10-CM

## 2024-08-26 MED ORDER — TIRZEPATIDE 2.5 MG/0.5ML SC SOAJ
2.5 mg | SUBCUTANEOUS | 2 refills | 28.00000 days | Status: AC
Start: 2024-08-26 — End: ?

## 2024-08-26 NOTE — Progress Notes
 [facility]     Primary Care Progress Note        Patient:  Calvin Chen  Medical record number:  [id number]  Date of birth:  13-Apr-1956  Date of service:  08/26/2024    Chief Complaint   Patient presents with    [facility] Discharge Follow Up     Dx: acute respiratory failure, hypoxia         BP 158/90  ~ Pulse 71  ~ Temp 36.5 ?C (97.7 ?F) (Tympanic)  ~ Resp 19  ~ Ht 5' 5.35'' (1.66 m)  ~ Wt 213 lb (96.6 kg)  ~ SpO2 95%  ~ BMI 35.06 kg/m?         Ambient Scribe Use Consent: This note was generated using assisted transcription with the verbal consent of the patient/guardian and all other parties present in the room. The note has been reviewed and edited accordingly by me.         SUBJECTIVE:     Aerik Polan is a 69 y.o. male who presents for :    History of Present Illness    Acute Shortness of Breath and Pulmonary Edema  - Sudden onset shortness of breath August 23, 2024  - Coughing with pink-tinged sputum at onset  - No chest pain with episode  - Occasional pink sputum with acid reflux, never with shortness of breath before  - Hospitalized August 23, 2024, for acute symptoms  - Oxygen saturation 90-91% on ER arrival  - Blood pressure recalled as 220 mmHg at onset  - Received BiPAP and lasix in ER; improvement before lasix administration  - Vomited into BiPAP mask during ER stay, cleaned up, then improvement noted  - dx with acute pulmonary edema, thought to be due to pulmonary urgency   - Weight loss after hospitalization (down to 208 lbs)  - No missed blood pressure medications prior to episode  - Denies chest pain during episode    Diabetes Management  - Taking rybelsus  14 mg, increased from 7 mg about two weeks prior  - A1c not at target as of last check November 2025    Lab Results   Component Value Date    HGBA1C 8.1 (H) 07/08/2024        Hypertension  - History of hypertension, on valsartan  and metoprolol    - No missed doses prior to acute episode    Kidney Function  - Creatinine higher during ER visit compared to previous values    Hyperlipidemia  - atorvastatin  40mg  daily     Lab Results   Component Value Date    CHOL 164 10/24/2023    CHOLHDL 49 10/24/2023    CHOLDLCAL 83 10/24/2023    TRIGLY 158 (H) 10/24/2023               Patient Active Problem List   Diagnosis    Atrial fibrillation (HCC/RAF)    Benign prostatic hyperplasia    Erectile dysfunction    HTN (hypertension)    Hyperlipidemia    Long term current use of anticoagulant    OSA on CPAP    Obesity    Urinary tract infectious disease    Hypertensive urgency    Diabetes mellitus (HCC/RAF)    Angiomyolipoma of right kidney    History of colon polyps    Liver cyst    Microalbuminuria due to type 2 diabetes mellitus (HCC/RAF)    Type 2 diabetes mellitus with stage 3a chronic kidney disease, without  long-term current use of insulin (HCC/RAF)    Hypertension associated with type 2 diabetes mellitus (HCC/RAF)    Type 2 diabetes mellitus with hyperlipidemia (HCC/RAF)    Severe obesity (BMI 35.0-35.9 with comorbidity) (HCC/RAF)    Hypertensive kidney disease with stage 3a chronic kidney disease (HCC/RAF)    History of basal cell carcinoma (BCC)    Hypercoagulable state, secondary       Medication:  Medications that the patient states to be currently taking   Medication Sig    atorvastatin  40 mg tablet Take 1 tablet (40 mg total) by mouth daily.    clindamycin 1% gel     ketoconazole 2% cream     metFORMIN  500 mg tablet Take 1 tablet (500 mg total) by mouth two (2) times daily.    metoprolol  tartrate 50 mg tablet Take 1 tablet (50 mg total) by mouth two (2) times daily.    rivaroxaban  (XARELTO ) 20 mg tablet Take 1 tablet (20 mg total) by mouth daily with dinner. Please make appointment for follow up    rivaroxaban  20 mg tablet Take 1 tablet (20 mg total) by mouth.    tamsulosin  0.4 mg capsule Take 1 capsule (0.4 mg total) by mouth.    valsartan  320 mg tablet Take 1 tablet (320 mg total) by mouth daily.    [DISCONTINUED] Semaglutide  (RYBELSUS ) 14 MG TABS Take 1 tablet (14 mg total) by mouth daily.       Allergies:   Allergies   Allergen Reactions    Nsaids Rash and Hives    Sulfa Antibiotics Rash     Bactrim  Diffuse maculopapular rash, fever    Naproxen Rash       Social History     Tobacco Use    Smoking status: Never     Passive exposure: Never    Smokeless tobacco: Never   Substance Use Topics    Alcohol use: Yes     Alcohol/week: 2.4 - 4.2 oz     Types: 4 - 7 Glasses of Wine (5 oz) per week     Comment: 0.5-1 glass daily          Family History   Problem Relation Age of Onset    Diabetes Mother     Heart disease Mother     Diabetes Father     Other (mitral valve disease) Brother     Anesthesia problems Neg Hx     Malignant hyperthermia Neg Hx        Review of Systems: see HPI      PHYSICAL EXAM:     BP 158/90  ~ Pulse 71  ~ Temp 36.5 ?C (97.7 ?F) (Tympanic)  ~ Resp 19  ~ Ht 5' 5.35'' (1.66 m)  ~ Wt 213 lb (96.6 kg)  ~ SpO2 95%  ~ BMI 35.06 kg/m?   Wt Readings from Last 3 Encounters:   08/26/24 213 lb (96.6 kg)   07/08/24 218 lb (98.9 kg)   06/29/24 207 lb (93.9 kg)     BP Readings from Last 3 Encounters:   08/26/24 158/90   07/08/24 153/87   04/15/24 169/113       GEN: WDWN. NAD. Obese   Physical Exam    - LUNGS: Lungs clear, no adventitious sounds, no rales.       CV: RRR     STUDIES:     Relevant labs and imaging were reviewed today by me in conjunction with the patient.    Results    -  Chest X-ray on August 23, 2024: evidence of pulmonary edema  - Echocardiogram on August 23, 2024: left ventricular ejection fraction 55-60%; grade I diastolic dysfunction; aortic valve sclerosis; mild aortic regurgitation  - Procalcitonin: low, suggestive of low likelihood of bacterial infection  - Serum creatinine in ER: 1.27 mg/dL, elevated from baseline  - Oxygen saturation on presentation: 90-91% on room air  - 24-hour inpatient blood pressure monitoring: maximum 170/106 mmHg  - Cardiac troponins: within normal limits         Office Visit on 07/08/2024   Component Date Value Ref Range Status    Albumin/Creat Ratio 07/08/2024 268.3 (H)  <30.0 mcg/mg Final    Albumin, Urine 07/08/2024 138.7  No Reference Range mg/L Final    Creatinine,Random Urine 07/08/2024 51.7  No Reference Range mg/dL Final    Hgb J8r 88/80/7974 8.1 (H)  <5.7 % HbA1c Final    For patients with diabetes, an A1c less than (<) or equal (=) to 7.0% is recommended for most patients, however the goal may be higher or lower depending on age and/or other medical problems.     For a diagnosis of diabetes, A1c greater than (>) or equal(=) to 6.5% indicates diabetes; values between 5.7% and 6.4% may indicate an increased risk of developing diabetes.    Hemoglobin A1c is determined with Roche Cobas immunoassay. The presence of abnormal hemoglobin variants, Thalassemia, and increased concentration of HbF (>7%), may result in lower HbA1c values.    Sodium 07/08/2024 140  135 - 146 mmol/L Final    Potassium 07/08/2024 3.9  3.6 - 5.3 mmol/L Final    Chloride 07/08/2024 99  96 - 106 mmol/L Final    Total CO2 07/08/2024 31 (H)  20 - 30 mmol/L Final    Anion Gap 07/08/2024 10  8 - 19 mmol/L Final    Anion gap (AG) is calculated using AG = Na -(Cl+CO2). Variations in the measurements of sodium, chloride, and CO2 can affect AG results.   In patients with hypoalbuminemia, the AG can be ''corrected'' by adding 2.5 to the calculated anion gap for every 1 g/dL decrease in albumin concentration.    Glucose 07/08/2024 149 (H)  65 - 99 mg/dL Final    Creatinine 88/80/7974 1.02  0.60 - 1.30 mg/dL Final    Estimated GFR 07/08/2024 81  See GFR Additional Information mL/min/1.60m2 Final    GFR Additional Information 07/08/2024 See Comment   Final    GFR >89.........SABRANormal   GFR 60 - 89...SABRASABRANormal to mildly decreased   GFR 45 - 59...SABRASABRAMildly to moderately decreased   GFR 30 - 44...SABRASABRAModerately to severely decreased   GFR 15 - 29...SABRASABRASeverely decreased   GFR <15.........SABRAKidney failure   The 2021 CKD-EPI creatinine equation was used to calculate the estimated GFR and assumes stable creatinine concentrations.   Results are in mL/min/1.73 square meters.  The patient's eGFR MAY need to be adjusted for drug dosing.   For drug dosing, utilize eGFR or eCrCl.   If using the eGFR in very large or very small patients, then multiply the reported eGFR by the estimated BSA and divide by 1.73 m2, in order to obtain eGFR in units of mL/min.    Urea Nitrogen 07/08/2024 23 (H)  7 - 22 mg/dL Final    Calcium  07/08/2024 9.3  8.6 - 10.4 mg/dL Final       Lab Results   Component Value Date    HGBA1C 8.1 (H) 07/08/2024  ASSESSMENT & PLAN:       Diagnoses and all orders for this visit:    Acute pulmonary edema (HCC/RAF)  -      Cardiology    Hypertensive urgency  -      Cardiology  -     US  duplex renal artery stenosis hypertension; Future  -     Basic Metabolic Panel; Future    Paroxysmal atrial fibrillation (HCC/RAF)  -      Cardiology    Primary hypertension  -      Cardiology    Hyperlipidemia, unspecified hyperlipidemia type  -      Cardiology    Type 2 diabetes mellitus without complication, without long-term current use of insulin (HCC/RAF)  -     tirzepatide  (MOUNJARO ) 2.5 mg/0.5 mL SC injection; Inject 0.5 mLs (2.5 mg total) under the skin once a week.    Class 2 severe obesity due to excess calories with serious comorbidity and body mass index (BMI) of 35.0 to 35.9 in adult    Encounter for screening for diabetes mellitus  -     eGFR by Creatinine  -     Albumin/Creat Ratio Ur        Assessment & Plan     Acute pulmonary edema (HCC/RAF):  - Flash pulmonary edema attributed to hypertensive urgency. Symptoms resolved with diuretics and BiPAP. No evidence of cardiac dysfunction on echocardiogram.  - Ordered renal artery ultrasound with Doppler to evaluate for possible renovascular etiology. Referral placed for cardiology follow-up with Dr. Charlanne on August 27, 2024.    Hypertensive urgency:  - Hypertensive urgency identified as the precipitating factor for pulmonary edema. Blood pressure remains elevated.  - Recommended follow-up with cardiology for medication adjustment. Will monitor blood pressure and reassess at next visit.    Paroxysmal atrial fibrillation (HCC/RAF):  - Continue Xarelto  as per cardiology recommendation.    Primary hypertension:  - Persistent elevated blood pressure noted.  - Continue metoprolol  and valsartan . Cardiology follow-up scheduled for further management.    Type 2 diabetes mellitus without complication, without long-term current use of insulin (HCC/RAF):  - Glycemic control suboptimal; currently on rybelsus  14 mg.  - Initiated prior authorization for Mounjaro . Instructed to continue rybelsus  until Mounjaro  or Ozempic  is available, then discontinue rybelsus . Recommended home blood glucose monitoring. Follow-up scheduled for mid-February to reassess glycemic control and A1c.  - recommend diet and exercise     Class 2 severe obesity due to excess calories with serious comorbidity and body mass index (BMI) of 35.0 to 35.9 in adult:  - Obesity with BMI 35. Weight loss recommended for overall health improvement.  - Discussed weight loss medications (Mounjaro /Ozempic ) and their benefits. Will reassess weight and BMI at follow-up.           Return in about 7 weeks (around 10/12/2024) for diabetes, hypertension.    Call or return to clinic prn if these symptoms worsen or fail to improve as anticipated.     The above plan was explained and reviewed with the patient.  All questions were answered.    40 minutes were spent personally by me today on this encounter which may include today's pre-visit review of the chart, time spent during the visit, and today's time spent after the visit  documenting and coordinating care.       Viviane Fusi, MD  Family Medicine  CPN [facility]

## 2024-08-27 ENCOUNTER — Ambulatory Visit: Payer: BLUE CROSS/BLUE SHIELD

## 2024-08-27 DIAGNOSIS — E7800 Pure hypercholesterolemia, unspecified: Principal | ICD-10-CM

## 2024-08-27 DIAGNOSIS — E785 Hyperlipidemia, unspecified: Secondary | ICD-10-CM

## 2024-08-27 DIAGNOSIS — E1169 Type 2 diabetes mellitus with other specified complication: Secondary | ICD-10-CM

## 2024-08-27 LAB — Albumin/Creatinine Ratio,Urine: ALBUMIN/CREATININE RATIO: 207.2 ug/mg — ABNORMAL HIGH (ref <30.0)

## 2024-08-27 LAB — Basic Metabolic Panel: GLUCOSE: 166 mg/dL — ABNORMAL HIGH (ref 65–99)

## 2024-08-27 MED ORDER — ATORVASTATIN CALCIUM 80 MG PO TABS
80 mg | ORAL_TABLET | Freq: Every day | ORAL | 3 refills | 90.00000 days | Status: AC
Start: 2024-08-27 — End: ?

## 2024-08-27 MED ORDER — AMLODIPINE BESYLATE 5 MG PO TABS
5 mg | ORAL_TABLET | Freq: Every day | ORAL | 3 refills | 90.00000 days | Status: AC
Start: 2024-08-27 — End: ?

## 2024-08-27 NOTE — Progress Notes
 OUTPATIENT CARDIOLOGY CONSULT    Date of Consult: 08/27/2024  Referring Provider: Marcello Prayer, MD    Reason for Consult: Atrial fibrillation     Problem List/Past Medical History:  -- Paroxysmal atrial fibrillation s/p RF ablation on 12/28/21    -- CHA2DS2-VASc Score of 2 = 2.2% Unadjusted ischemic stroke rate (% per year)   -- Calvin Chen 07/2017 diagnosed AF with RVR   -- S/p RFA with PVI on 12/28/21   -- On Xarelto  and Metoprolol     -- No AF on 11/2023 Zio  -- Mild AR (Echo, 08/2024)  -- HTN  -- HLD  -- DMII      History of Present Illness:   Calvin Chen is a 69 y.o. male who has a past medical history significant for the above who returns for follow up. Lats seen 11/05/23.     Patient was diagnosed with paroxysmal atrial fibrillation in 07/2017 s/p RF ablation on 12/28/21. CHA2DS2-VASc Score of 2 = 2.2%. He remains on Xarelto  and Metoprolol . 03/2022 Holter post ablation with no AF. No AF on Zio 11/2023. Patient is asymptomatic from a cardiac perspective. No CP, SOB/DOE or palpitations. Had recent SOB that may have been due to URI, seen at [facility]. Echo normal with exception to mild AR.     Exercises by walking at least an hour daily without symptoms. There has been no change in exercise tolerance.     ACC/AHA Cholesterol Management Guideline Recommendations:   Moderate or high-intensity statin because of diabetes or 10-year ASCVD risk is >= 10%. Patient is currently receiving guideline-directed statin therapy.    ASCVD Risk Score    10-year ASCVD risk  is 34.9% as of 1:17 PM on 08/27/2024.   10-year ASCVD risk with optimal risk factors  is 9.6%.   Values used to calculate ASCVD Risk Score    Age  31 y.o.     Gender  male   Race  Choose Not to Answer   HDL Cholesterol  49 mg/dL. (measured on 10/24/2023)   LDL Cholesterol  83 mg/dL. (measured on 10/24/2023)   Total Cholesterol  164 mg/dL. (measured on 10/24/2023)   Systolic Blood Pressure  158 mm Hg. (measured on 08/27/2024)   Blood Pressure Medication Present  Yes   Smoking Status currently not a smoker   Diabetes Present  Yes     Click here for the [facility] ASCVD Cardiovascular Risk Estimator Plus tool office manager).       10-year ASCVD risk  is 34.9% as of 1:17 PM on 08/27/2024    Review of Systems:   Please see HPI for pertinent positives and negatives; otherwise a complete 14 point ROS was reviewed and found to be negative.     Allergies:  Allergies   Allergen Reactions    Nsaids Rash and Hives    Sulfa Antibiotics Rash     Bactrim  Diffuse maculopapular rash, fever    Naproxen Rash       Lipids:  Last 3 Lipids (Up to last 3 results from the past 87039 hours)        03/06 1700    Cholesterol       164  Comment: The significance of total cholesterol depends on the values of LDL, HDL, triglycerides and the clinical context. A patient-provider discussion may be considered.         Cholesterol, HDL       49  Comment: If HDL cholesterol level falls outside of the designatedrange, a patient-provider discussion  is recommended         Triglycerides       158  Comment: If Triglyceride level falls outside of the designated range,a patient-provider discussion is recommended.         Non-HDL,Chol,Calc       115  Comment: If Non-HDL cholesterol level falls outside of the designatedrange, a patient-provider discussion is recommended.                 Diabetes/Diabetes Risk:  Hgb A1c   Date Value Ref Range Status   07/08/2024 8.1 (H) <5.7 % HbA1c Final     Comment:     For patients with diabetes, an A1c less than (<) or equal (=) to 7.0% is recommended for most patients, however the goal may be higher or lower depending on age and/or other medical problems.     For a diagnosis of diabetes, A1c greater than (>) or equal(=) to 6.5% indicates diabetes; values between 5.7% and 6.4% may indicate an increased risk of developing diabetes.    Hemoglobin A1c is determined with Roche Cobas immunoassay. The presence of abnormal hemoglobin variants, Thalassemia, and increased concentration of HbF (>7%), may result in lower HbA1c values.   04/15/2024 7.8 (H) <5.7 % HbA1c Final     Comment:     For patients with diabetes, an A1c less than (<) or equal (=) to 7.0% is recommended for most patients, however the goal may be higher or lower depending on age and/or other medical problems.     For a diagnosis of diabetes, A1c greater than (>) or equal(=) to 6.5% indicates diabetes; values between 5.7% and 6.4% may indicate an increased risk of developing diabetes.    Hemoglobin A1c is determined with Roche Cobas immunoassay. The presence of abnormal hemoglobin variants, Thalassemia, and increased concentration of HbF (>7%), may result in lower HbA1c values.   10/24/2023 7.7 (H) <5.7 % HbA1c Final     Comment:     For patients with diabetes, an A1c less than (<) or equal (=) to 7.0% is recommended for most patients, however the goal may be higher or lower depending on age and/or other medical problems.     For a diagnosis of diabetes, A1c greater than (>) or equal(=) to 6.5% indicates diabetes; values between 5.7% and 6.4% may indicate an increased risk of developing diabetes.    Hemoglobin A1c is determined with Roche Cobas immunoassay. The presence of abnormal hemoglobin variants, Thalassemia, and increased concentration of HbF (>7%), may result in lower HbA1c values.        Current Medications:    Current Outpatient Medications:     atorvastatin  40 mg tablet, Take 1 tablet (40 mg total) by mouth daily., Disp: 90 tablet, Rfl: 3    clindamycin 1% gel, , Disp: , Rfl:     ketoconazole 2% cream, , Disp: , Rfl:     metFORMIN  500 mg tablet, Take 1 tablet (500 mg total) by mouth two (2) times daily., Disp: 180 tablet, Rfl: 1    metoprolol  tartrate 50 mg tablet, Take 1 tablet (50 mg total) by mouth two (2) times daily., Disp: 180 tablet, Rfl: 3    rivaroxaban  (XARELTO ) 20 mg tablet, Take 1 tablet (20 mg total) by mouth daily with dinner. Please make appointment for follow up, Disp: 90 tablet, Rfl: 3    valsartan  320 mg tablet, Take 1 tablet (320 mg total) by mouth daily., Disp: 90 tablet, Rfl: 0    rivaroxaban  20 mg  tablet, Take 1 tablet (20 mg total) by mouth., Disp: , Rfl:     tamsulosin  0.4 mg capsule, Take 1 capsule (0.4 mg total) by mouth., Disp: , Rfl:     tirzepatide  (MOUNJARO ) 2.5 mg/0.5 mL SC injection, Inject 0.5 mLs (2.5 mg total) under the skin once a week., Disp: 2 mL, Rfl: 2  No current facility-administered medications for this visit.    Social History:  Social History     Socioeconomic History    Marital status: Married   Occupational History    Occupation: Engineer, Drilling: Teacher, english as a foreign language of [employer]   Tobacco Use    Smoking status: Never     Passive exposure: Never    Smokeless tobacco: Never   Vaping Use    Vaping status: Never Used   Substance and Sexual Activity    Alcohol use: Yes     Alcohol/week: 2.4 - 4.2 oz     Types: 4 - 7 Glasses of Wine (5 oz) per week     Comment: 0.5-1 glass daily    Drug use: No    Sexual activity: Yes     Partners: Female   Other Topics Concern    Do you exercise at least a day, 3 or more days a week? Yes    Types of Exercise? (List in Comments) Yes     Comment: Walking, cycling, gym    Do you follow a special diet? No    Vegan? No    Vegetarian? No    Pescatarian? No    Lactose Free? No    Gluten Free? No    Omnivore? Yes    1. Need Help Feeding Yourself? No    2. Need Help Getting Dressed? No    3. Need Help Using the Telephone? No    4. Need Help Managing Money? Tree Surgeon, Paying Bills) No    5. Need Help Shopping for Groceries? No    6. Need Help Getting Places Beyond Walking Distance? (Bus, Taxi) No    7. Need Help Getting from Bed to Chair? No    8. Need Help Bathing or Showering? No    9. Need Help Taking your Medications? No    10.  Need Help Doing Moderately Strenuous Housework? (ex. Laundry) No    11. Need Help Driving? No    12. Need Help Getting to the Toilet? No    13. Need Help Walking Across the Road? (Includes [place], Walker) No    14. Need Help Preparing Meals? No    15. Need Help Shopping for Personal Items? (Toiletries, Medicines) No    16. Need Help Climbing a Flight of Stairs? No    17. Do you live with someone who assists you at home? No    18. Do you get help from family members or friends in your home? No    19. Do you employ someone to provide health related care or help you in your home? No    20. Do you provide care for a family member? No    21. Does your home have rugs in the hallway? No    22. Does your home have poor lighting? No    23. Does your home lack grab bars in the bathroom? Yes    24. Does your home lack handrails on the stairs? No    25. Have you noticed any hearing difficulties? Yes    26. Do you currently participate in  any regular activity to improve or maintain your physical fitness? Yes    27. Do you always wear a seatbelt when you ride in a car? Yes    28. If you drink alcohol, do you drink more than 7 drinks per week or more than 3 drinks on any given day? No    29. Has anyone ever been concerned about your drinking? No   Social History Narrative    Diet: lower appetite over the last year, working on portion size, eating more vegetables and lighter proteins     Exercise: walking more, 5K this weekend     Sleep: 7-8 hours last night      Social Drivers of Health     Physical Activity: Insufficiently Active (07/27/2024)    Physical Activity     Days of Exercise per Week: 7 days   Stress: No Stress Concern Present (07/27/2024)    Stress     Feeling of Stress : Only a little   Physicist, Medical Strain: Low Risk (08/24/2024)    Received from [facility]    Financial Resource Strain     In the past 12 months has the electric, gas, oil, or water company threatened to shut off services in your home?: No     Difficulty of Paying Living Expenses: Not on file       Family History:  Family history is negative for early cardiac disease, early MI and SCD.     Physical Exam:  Last Recorded Vital Signs:    08/27/24 1311   BP: 158/87 Pulse: 69   Resp:    Temp:    SpO2:      General: well developed well nourished male in no acute distress  HEENT: extraocular movements intact, anicteric, oropharynx clear, moist mucous membranes  Neck: no jugular venous distention.   Heart: regular rate and rhythm, normal S1, S2. no rubs, murmurs, or gallops  Lungs: clear to auscultation bilaterally, no retractions  Abd: non distended  Ext: no clubbing, cyanosis, or edema  Skin: warm, dry , and well perfused  Neuro: grossly non focal  Psych: normal mood and affect    Laboratory Studies:  Lab Results   Component Value Date    WBC 9.75 10/24/2023    HGB 14.2 10/24/2023    HCT 43.3 10/24/2023    MCV 83.6 10/24/2023    PLT 332 10/24/2023      Lab Results   Component Value Date    CREAT 1.02 08/26/2024    BUN 25 (H) 08/26/2024    NA 140 08/26/2024    K 3.8 08/26/2024    CL 102 08/26/2024    CO2 25 08/26/2024      Lab Results   Component Value Date    HGBA1C 8.1 (H) 07/08/2024      Lab Results   Component Value Date    ALT 21 10/24/2023    AST 17 10/24/2023    ALKPHOS 84 10/24/2023    BILITOT 0.3 10/24/2023      Lab Results   Component Value Date    TSH 1.1 10/24/2023      Lab Results   Component Value Date    MG 2.0 (H) 05/22/2018    CALCIUM  9.1 08/26/2024    PHOS 3.9 05/19/2018      Lab Results   Component Value Date    CHOL 164 10/24/2023    CHOLHDL 49 10/24/2023    CHOLDLCAL 83 10/24/2023    TRIGLY 158 (H)  10/24/2023     No results found for: ''HSCRP''    Diagnostic Studies:   EKG, performed on 3//18/25 and reviewed by me: NSR, 62 bpm, inferolateral TWI      OSH Echo, 08/2024:  1. Left ventricle is normal in size and systolic function. Visually estimated LVEF is 55-60%. No gross regional wall motion abnormalities. There is grade I diastolic dysfunction.    2. Right ventricular size and systolic function are within normal limits.    3. Aortic valve sclerosis. Mild aortic valve regurgitation.    4. The left atrium is normal in size.    5. Normal pericardium without significant pericardial effusion.     Zio 11/2023  Patient had a min HR of 47 bpm, max HR of 141 bpm, and avg HR of 65  bpm. Predominant underlying rhythm was Sinus Rhythm. 9  Supraventricular Tachycardia runs occurred, the run with the fastest  interval lasting 7 beats with a max rate of 141 bpm, the longest lasting 8  beats with an avg rate of 120 bpm. Some episodes of Supraventricular  Tachycardia may be possible Atrial Tachycardia with variable block.  Isolated SVEs were rare (<1.0%), SVE Couplets were rare (<1.0%), and  SVE Triplets were rare (<1.0%). Isolated VEs were rare (<1.0%, 31), VE  Triplets were rare (<1.0%, 1), and no VE Couplets were present.  Ventricular Trigeminy was present.    OSH Holter 03/2022 - post ablation:   Predominant underlying rhythm was Sinus Rhythm (avg 70 48-123bpm).   Event labeled NSVT is 5 beats of SVT with aberrancy. 14   Supraventricular Tachycardia runs occurred, the run with the   fastest interval lasting 4 beats with a max rate of 182 bpm, the   longest lasting 11 beats with an avg rate of 107 bpm. Isolated SVEs   were rare (<1.0%). Isolated VEs were rare (<1.0%, 25). No events   were noted during the monitoring period.       Assessment:  69 year old M with:     -- Paroxysmal atrial fibrillation s/p RF ablation on 12/28/21    -- CHA2DS2-VASc Score of 2 = 2.2% Unadjusted ischemic stroke rate (% per year)   -- Calvin Chen 07/2017 diagnosed AF with RVR   -- S/p RFA with PVI on 12/28/21   -- On Xarelto  and Metoprolol     -- No AF on 11/2023 Zio  -- Mild AR (Echo, 08/2024)  -- HTN  -- HLD  -- DMII    Plan:    AF  --  CTM patient for symptoms. Currently asymptomatic.   -- Continue Xarelto  20 mg daily. No bleeding/bruising.   -- Continue Metoprolol  50 mg BID.     HTN - needs better control  -- Continue  Valsartan  320 mg daily.   -- Add Amlodipine  5 mg daily    DMII/HLD  -- Continue Metformin  500 mg daily.   -- Increase Atorvastatin  to 80 mg daily.     -- RTC in 6-12 months with labs prior. All questions were answered. The patient understands and agrees with the plan stated above.     Thank you for allowing me to participate in the care of your patient. Please do not hesitate to contact me with any questions.     Kaladin Noseworthy P. Charlanne, MD, PhD  Assistant Professor  Division of Cardiology  [employer] at [facility]  Phone: [phone]  Pager: [phone]

## 2024-08-28 ENCOUNTER — Ambulatory Visit: Payer: BLUE CROSS/BLUE SHIELD | Attending: Student in an Organized Health Care Education/Training Program

## 2024-08-28 DIAGNOSIS — L814 Other melanin hyperpigmentation: Secondary | ICD-10-CM

## 2024-08-28 DIAGNOSIS — L821 Other seborrheic keratosis: Principal | ICD-10-CM

## 2024-08-28 DIAGNOSIS — L910 Hypertrophic scar: Secondary | ICD-10-CM

## 2024-08-28 DIAGNOSIS — D229 Melanocytic nevi, unspecified: Secondary | ICD-10-CM

## 2024-08-28 DIAGNOSIS — L570 Actinic keratosis: Secondary | ICD-10-CM

## 2024-08-28 NOTE — Patient Instructions
 WOUND CARE INSTRUCTIONS:  Cryotherapy (liquid nitrogen) was performed in clinic today.  Expect redness and irritation of the treated sites over the next several days.  The sites may scab and/or blister.  If the site scabs or blisters, apply plain petrolatum (Vaseline) or Aquaphor to the sites after gentle cleansing daily to twice daily to assist in healing.  It should heal within 1-2 weeks. You may be left with dyspigmentation, this should resolve over time. Please practice sun protection over the treated site.   Please contact the clinic should you have any questions or concerns.       IMPORTANT  Information on Using MyChart Message Portal:  Please carefully read the following MyChart message guidance for patients. This guidance is to ensure proper evaluation and quality care of our patients.  MyChart messages should be used only for NON-URGENT and brief questions related to two matters: (1) problems with obtaining your medications that were prescribed at your last visit. For example, you were not able to obtain the medication; pharmacy did not have the medication; insurance did not cover the medication. (2) problems related to surgical procedures performed (example: biopsies) at the last visit, such as unexpected bleeding, etc.     For all other issues, please kindly call the clinic for an in-person or telemedicine (online) appointment [facility] Telephone: [phone] or  [facility] Telephone: [phone]). This is to ensure that we will conduct a proper examination and evaluation of your skin problems. Please kindly do not send pictures of skin rashes or spots via MyChart message for evaluation because we will not be able to properly evaluate them. Please also kindly do not send questions relating to new skin problems because we will need an appointment to properly evaluate these concerns. If your question is urgent, when you call our clinic to make an appointment, please request an urgent appointment with the provider. We will do our best to accommodate you. However, if we cannot accommodate you on a particular provider's schedule, please be prepared to possibly see another provider.     If you still would like to send questions via MyChart that are not directly related to obtaining medication or post-procedure problems, please note that your insurance may be billed for the time and effort your provider takes to respond to this message.  Your insurance may not cover these expenses. You will be responsible for all charges and expenses related to MyChart communications that are not covered by your insurance. It is your responsibility to check with your insurance company whether this is covered and to determine your out-of-pocket expenses.     For prescription refills: In general, we will provide enough refills until your next appointment with us . Thus, please first check with your pharmacy first to see if there are any refills left on your prescription. If you have not been seen for longer than one year, you will need to call the clinic to schedule an appointment before we can refill your medications.     Prior authorizations: Based on your insurance, some medications require prior authorizations. You may need to contact your insurance company regarding details of prior authorization. You can also contact our clinic ([phone]) if you have not heard about the approval status of your medication after two weeks of visiting your provider.     Medications that are not covered by insurance: For medications that are not covered by your insurance, one good resource to check out to reduce your out-of-pocket  cost is GoodRx. Please check out goodrx (goodrx.com or download the app).

## 2024-08-28 NOTE — Progress Notes
 [facility] - Division of Dermatology    Patient: Calvin Chen  MRN: [id number]  DOB: Jun 11, 1956   Date: 08/28/2024  Referral From: Marcello Prayer, MD  Primary Care Provider: Marcello Prayer, MD    Chief Complaint   Patient presents with    General Skin Check     Subjective:  Calvin Chen is a 69 y.o. male who presents for a skin check:    The patient is here for a full skin exam.    History of BCC on the right nasal side wall s/p MMS 02/2019    Other concerns:   None    (click to expand/collapse)    Past Medical History:   Diagnosis Date    Atrial fibrillation (HCC/RAF)     Chicken pox 1965    Diabetes mellitus (HCC/RAF) 10/24/2023    GERD (gastroesophageal reflux disease)     Hyperlipidemia 2009    Hypertension 2009    Hypertensive kidney disease with stage 3a chronic kidney disease (HCC/RAF) 07/27/2024    Kidney stone 2018    Seasonal allergies 1969    Skin cancer 2010    Sleep apnea 2014    Use APAP     Past Surgical History:   Procedure Laterality Date    CARDIAC ELECTROPHYSIOLOGY MAPPING AND ABLATION      afib    COLONOSCOPY       Outpatient Medications Prior to Visit   Medication Sig    amLODIPine  5 mg tablet Take 1 tablet (5 mg total) by mouth daily.    atorvastatin  80 mg tablet Take 1 tablet (80 mg total) by mouth daily.    clindamycin 1% gel     ketoconazole 2% cream     metFORMIN  500 mg tablet Take 1 tablet (500 mg total) by mouth two (2) times daily.    metoprolol  tartrate 50 mg tablet Take 1 tablet (50 mg total) by mouth two (2) times daily.    rivaroxaban  (XARELTO ) 20 mg tablet Take 1 tablet (20 mg total) by mouth daily with dinner. Please make appointment for follow up    rivaroxaban  20 mg tablet Take 1 tablet (20 mg total) by mouth.    tamsulosin  0.4 mg capsule Take 1 capsule (0.4 mg total) by mouth.    tirzepatide  (MOUNJARO ) 2.5 mg/0.5 mL SC injection Inject 0.5 mLs (2.5 mg total) under the skin once a week.    valsartan  320 mg tablet Take 1 tablet (320 mg total) by mouth daily.     No facility-administered medications prior to visit.     Allergies   Allergen Reactions    Nsaids Rash and Hives    Sulfa Antibiotics Rash     Bactrim  Diffuse maculopapular rash, fever    Naproxen Rash     Family History   Problem Relation Age of Onset    Diabetes Mother     Heart disease Mother     Diabetes Father     Other (mitral valve disease) Brother     Anesthesia problems Neg Hx     Malignant hyperthermia Neg Hx        Review of Systems:  Constitutional: Negative for fevers, chills, and night sweats  Skin:  Otherwise negative    Physical Exam:  General: Pleasant, alert, cooperative  Psychiatric: Normal mood and affect     Skin type: II  Skin examined on scalp, head, eyes, lips, neck, chest, back, abdomen, right upper extremity, right hand, left upper extremity, left hand, right lower extremity,  right foot, left lower extremity, left foot, and digits/nails.    Generalized  - Waxy appearing papules  Generalized  - Scattered well circumscribed, homogeneously tan - brown macules and thin papules with benign features when examined under dermoscopy  Generalized  - Tan macules on sun-exposed areas  Left Supratip of Nose, Left Temple, Mid Forehead, Mid Parietal Scalp (2)  - Gritty papules  Left Upper Arm - Posterior  - Pink firm papule  A chaperone was offered for the physical exam, and the patient declined        Assessment and Plan:    SEBORRHEIC KERATOSES  Generalized  - discussed likely diagnosis with patient  - counseled patient to return to clinic sooner for any new or changing skin lesions or skin concerns  MULTIPLE NEVI  Generalized  Clinically benign appearing nevi, none concerning today  - Reviewed the ABCDEs of moles and melanoma with the patient, and self exams    - Counseled on sun protection/avoidance, SPF > 30  - Counseled patient to return to clinic sooner for any new or changing skin lesions or skin concerns  LENTIGINES  Generalized  - discussed likely diagnosis with patient  - counseled that these are related to actinic damage  - counseled on sun protection/avoidance, sunscreen SPF >30  ACTINIC KERATOSIS (5)  Left Supratip of Nose, Left Temple, Mid Forehead, Mid Parietal Scalp (2)  - discussed premalignant nature of lesions and relation to actinic damage  - discussed diagnostic and treatment options with recommendation for cryotherapy and possible need for multiple rounds of cryotherapy  - recommend follow-up if lesion(s) fail to fully resolve  - counseled on sun protection/avoidance, sunscreen SPF >30  - Destruction of Lesion - Left Supratip of Nose, Left Temple, Mid Forehead, Mid Parietal Scalp (2)  Complexity: simple    Destruction method: cryotherapy    Informed consent: discussed and consent obtained    Outcome: patient tolerated procedure well with no complications    Post-procedure details: wound care instructions given      KELOID SCAR  Left Upper Arm - Posterior  - reviewed treatment options including active monitoring, intralesional triamcinolone   - Patient not bothered     The above plan of care, diagnosis, orders, and follow-up were discussed with the patient and/or guardian(s). Questions related to this recommended plan of care were answered.    Follow-up: 6 months or sooner as needed    Thayer Inabinet, MD  Division of Dermatology  [facility]    CC: Marcello Prayer, MD

## 2024-08-31 ENCOUNTER — Ambulatory Visit: Payer: PRIVATE HEALTH INSURANCE | Attending: Foot & Ankle Surgery

## 2024-08-31 DIAGNOSIS — M2142 Flat foot [pes planus] (acquired), left foot: Principal | ICD-10-CM

## 2024-08-31 DIAGNOSIS — E1165 Type 2 diabetes mellitus with hyperglycemia: Secondary | ICD-10-CM

## 2024-08-31 DIAGNOSIS — M2141 Flat foot [pes planus] (acquired), right foot: Secondary | ICD-10-CM

## 2024-08-31 NOTE — Progress Notes
 Calvin Chen  FMW:[id number]  DOB:[birth year]  DATE OF SERVICE: 08/31/2024     Referring physician: Marcello Prayer, MD    CHIEF COMPLAINT   Foot check, orthotics    HISTORY OF PRESENT ILLNESS   The patient is a pleasant 69 y.o. male who presents today for foot check and concern for orthotics wearing out. No immediate issues, but has had issues with pain at the ball of the feet.  History of chipped bone to big toe on the right side.   Notes poor arches  Orthotics have helped significantly but current pair is ratty.    PAST MEDICAL HISTORY     Past Medical History:   Diagnosis Date    Atrial fibrillation (HCC/RAF)     Chicken pox 1965    Diabetes mellitus (HCC/RAF) 10/24/2023    GERD (gastroesophageal reflux disease)     Hyperlipidemia 2009    Hypertension 2009    Hypertensive kidney disease with stage 3a chronic kidney disease (HCC/RAF) 07/27/2024    Kidney stone 2018    Seasonal allergies 1969    Skin cancer 2010    Sleep apnea 2014    Use APAP       MEDICATIONS     Outpatient Medications Prior to Visit   Medication Sig    amLODIPine  5 mg tablet Take 1 tablet (5 mg total) by mouth daily.    atorvastatin  80 mg tablet Take 1 tablet (80 mg total) by mouth daily.    clindamycin 1% gel     ketoconazole 2% cream     metFORMIN  500 mg tablet Take 1 tablet (500 mg total) by mouth two (2) times daily.    metoprolol  tartrate 50 mg tablet Take 1 tablet (50 mg total) by mouth two (2) times daily.    rivaroxaban  (XARELTO ) 20 mg tablet Take 1 tablet (20 mg total) by mouth daily with dinner. Please make appointment for follow up    rivaroxaban  20 mg tablet Take 1 tablet (20 mg total) by mouth.    tamsulosin  0.4 mg capsule Take 1 capsule (0.4 mg total) by mouth.    tirzepatide  (MOUNJARO ) 2.5 mg/0.5 mL SC injection Inject 0.5 mLs (2.5 mg total) under the skin once a week.    valsartan  320 mg tablet Take 1 tablet (320 mg total) by mouth daily.     No facility-administered medications prior to visit.       ALLERGIES     Allergies Allergen Reactions    Nsaids Rash and Hives    Sulfa Antibiotics Rash     Bactrim  Diffuse maculopapular rash, fever    Naproxen Rash       SURGICAL HISTORY     Past Surgical History:   Procedure Laterality Date    CARDIAC ELECTROPHYSIOLOGY MAPPING AND ABLATION      afib    COLONOSCOPY         SOCIAL HISTORY     Social History     Tobacco Use   Smoking Status Never    Passive exposure: Never   Smokeless Tobacco Never     Social History     Substance and Sexual Activity   Alcohol Use Yes    Alcohol/week: 2.4 - 4.2 oz    Types: 4 - 7 Glasses of Wine (5 oz) per week    Comment: 0.5-1 glass daily       FAMILY HISTORY     Family History   Problem Relation Age of Onset    Diabetes Mother  Heart disease Mother     Diabetes Father     Other (mitral valve disease) Brother     Anesthesia problems Neg Hx     Malignant hyperthermia Neg Hx        ROS     Review of Systems    [x]  Upon full 12 point review of systems seen below, all findings were negative    Constitutional   []  Fever [] Chills [] Sweats [] Weight Change   Head, Eyes, Ears, Nose and Throat   [] Wear Contact Lenses [] Dentures [] Wearing Eyeglasses   [] Double Vision [] Cataract [] Dizziness   [] Difficulty Swallowing [] Neck Pain [] Sore Throat   [] Nosebleeds [] Problems with eyesight [] Ringing in the Ears   Cardiovascular   [] Chest Pain/Discomfort [] Cardiovascular Symptom [] Heart Murmur   [] Swelling lower extremity [] Leg pain with exercise [] Palpitations   Hematology/Lymphatic   [] Bleeding Problem [] Swollen Glands [] Lymphoma   [] Anemia [] Skin Lump - Location    Respiratory   [] Difficulty Breathing [] Wheezing [] Previous Pulmonary Disease   [] Exposure to TB [] Cough [] Pulmonary Symptoms   Gastrointestinal   [] Nausea [] Vomiting []  Diarrhea   [] Decrease in Appetite [] Abdominal Pain [] Constipation   Endocrine   [] Often Thirsty [] Frequent Urination [] Thyroid Disease   [] Urinary Symptoms [] Prostate Problems [] Prior Kidney Disease   Musculoskeletal   []  Musculoskeletal symptoms [] Feeling weak [] Joint Pain, Arthralgia   [] Weakness of limbs [] Prior Fracture    Nervous System   [] Ataxia [] Speech Difficulties [] Headache   [] Neuropathy [] Confusion/Disorientation [] Fainting   [] Convulsions    Skin   [] Rash [] Ulcer [] Lesions [] Sun Sensitivity   [] Color change [] Slow Healing [] Infections [] Cracking   [] Eczema (Pruritus) [] Growth [] Hair Loss   Allergic Immunologic History   [] Dermatitis [] Rheumatoid Arthritis [] Lupus [] Collagen Vascular   Psychiatric   [] Nervousness [] Tension [] Depression      PHYSICAL EXAM     GEN:  No acute distress.  The patient is alert/oriented x3 and demonstrates appropriate mood and affect.  The patient looks stated age and appears well nourished.    VASC:  There are 2/4 palpable pedal pulses to both feet.    The feet are warm to touch and there is brisk capillary refill time to all the toes.    There are no varicosities or telangiectasias to the ankles and hindfeet.  There is no pitting edema.    DERM:   Skin turgor and tone are within normal limits.     NEURO:  The patient demonstrates intact light touch and protective threshold sensation with monofilament wire testing to the feet.    No sensory deficits noted.    No motor weakness with resisted range of motion testing about the ankles and toes.  Intact pedal reflexes.   There is no tinel's sign with percussion to the posterior tibial nerve.    MSK:  he has pes plano valgus deformity.    There is complete collapse of the medial arch on weightbearing exam.    The ankle, midtarsal and subtalar joints are supple and hypermobile.   There is no tenderness to palpation at the moment to the feet or ankles.    he does point to the arches of both feet as the location of pain with high impact activity.    No evidence of muscle weakness noted.    No other gross deformity.    Lab Results   Component Value Date    HGBA1C 8.1 (H) 07/08/2024       ASSESSMENT/PLAN   Calvin Chen is a 69 y.o. 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  Recommend supportive stability type shoes for pronated flat feet  Diabetic foot exam performed, reviewed education with patient, currently low risk. Recommend improve blood sugar control to minimize risks of developing complications such as peripheral neuropathy, peripheral arterial disease, foot ulcers and infections.  Current custom orthotics with significant signs of wear, recommend new orthotics for continued support  We will submit a request to the [facility] for new custom functional foot orthotics.  Once approved the patient will return to the office to be fitted.    Duwaine Crane, DPM  08/31/2024

## 2024-09-01 DIAGNOSIS — M79672 Pain in left foot: Secondary | ICD-10-CM

## 2024-09-01 DIAGNOSIS — M79671 Pain in right foot: Secondary | ICD-10-CM

## 2024-09-03 ENCOUNTER — Other Ambulatory Visit: Payer: Commercial Managed Care - Pharmacy Benefit Manager

## 2024-09-04 ENCOUNTER — Other Ambulatory Visit: Payer: PRIVATE HEALTH INSURANCE
